# Patient Record
Sex: Female | Born: 1980 | Race: White | Hispanic: No | Smoking: Never smoker
Health system: Southern US, Community
[De-identification: ages and names within clinical notes are randomized; demographics above are authoritative.]

## PROBLEM LIST (undated history)

## (undated) DIAGNOSIS — N3010 Interstitial cystitis (chronic) without hematuria: Secondary | ICD-10-CM

## (undated) DIAGNOSIS — B009 Herpesviral infection, unspecified: Secondary | ICD-10-CM

## (undated) DIAGNOSIS — F988 Other specified behavioral and emotional disorders with onset usually occurring in childhood and adolescence: Secondary | ICD-10-CM

## (undated) HISTORY — DX: Other specified behavioral and emotional disorders with onset usually occurring in childhood and adolescence: F98.8

## (undated) HISTORY — DX: Herpesviral infection, unspecified: B00.9

---

## 2000-01-10 ENCOUNTER — Other Ambulatory Visit: Admission: RE | Admit: 2000-01-10 | Discharge: 2000-01-10 | Payer: Self-pay | Admitting: Obstetrics and Gynecology

## 2000-01-23 ENCOUNTER — Emergency Department (HOSPITAL_COMMUNITY): Admission: EM | Admit: 2000-01-23 | Discharge: 2000-01-23 | Payer: Self-pay | Admitting: *Deleted

## 2000-03-24 ENCOUNTER — Encounter: Payer: Self-pay | Admitting: *Deleted

## 2000-03-24 ENCOUNTER — Inpatient Hospital Stay (HOSPITAL_COMMUNITY): Admission: AD | Admit: 2000-03-24 | Discharge: 2000-03-24 | Payer: Self-pay | Admitting: *Deleted

## 2000-04-02 ENCOUNTER — Encounter: Admission: RE | Admit: 2000-04-02 | Discharge: 2000-04-02 | Payer: Self-pay | Admitting: Obstetrics

## 2000-04-16 ENCOUNTER — Other Ambulatory Visit: Admission: RE | Admit: 2000-04-16 | Discharge: 2000-04-16 | Payer: Self-pay | Admitting: Obstetrics and Gynecology

## 2000-04-16 ENCOUNTER — Encounter (INDEPENDENT_AMBULATORY_CARE_PROVIDER_SITE_OTHER): Payer: Self-pay

## 2000-05-11 ENCOUNTER — Emergency Department (HOSPITAL_COMMUNITY): Admission: EM | Admit: 2000-05-11 | Discharge: 2000-05-11 | Payer: Self-pay | Admitting: Emergency Medicine

## 2008-02-22 ENCOUNTER — Ambulatory Visit: Payer: Self-pay | Admitting: Occupational Medicine

## 2008-02-25 ENCOUNTER — Ambulatory Visit: Payer: Self-pay | Admitting: Family Medicine

## 2008-02-25 DIAGNOSIS — J029 Acute pharyngitis, unspecified: Secondary | ICD-10-CM

## 2008-02-25 LAB — CONVERTED CEMR LAB
Heterophile Ab Screen: NEGATIVE
Rapid Strep: NEGATIVE

## 2008-05-16 ENCOUNTER — Ambulatory Visit: Payer: Self-pay | Admitting: Occupational Medicine

## 2008-05-16 LAB — CONVERTED CEMR LAB: Beta hcg, urine, semiquantitative: NEGATIVE

## 2008-06-03 ENCOUNTER — Emergency Department (HOSPITAL_COMMUNITY): Admission: EM | Admit: 2008-06-03 | Discharge: 2008-06-03 | Payer: Self-pay | Admitting: Emergency Medicine

## 2008-10-22 ENCOUNTER — Emergency Department (HOSPITAL_COMMUNITY): Admission: EM | Admit: 2008-10-22 | Discharge: 2008-10-22 | Payer: Self-pay | Admitting: Emergency Medicine

## 2008-11-17 ENCOUNTER — Emergency Department (HOSPITAL_COMMUNITY): Admission: EM | Admit: 2008-11-17 | Discharge: 2008-11-17 | Payer: Self-pay | Admitting: Emergency Medicine

## 2008-12-07 ENCOUNTER — Emergency Department (HOSPITAL_COMMUNITY): Admission: EM | Admit: 2008-12-07 | Discharge: 2008-12-07 | Payer: Self-pay | Admitting: Emergency Medicine

## 2009-01-07 ENCOUNTER — Ambulatory Visit: Payer: Self-pay | Admitting: Family Medicine

## 2009-01-07 DIAGNOSIS — J069 Acute upper respiratory infection, unspecified: Secondary | ICD-10-CM | POA: Insufficient documentation

## 2009-01-20 ENCOUNTER — Emergency Department (HOSPITAL_COMMUNITY): Admission: EM | Admit: 2009-01-20 | Discharge: 2009-01-20 | Payer: Self-pay | Admitting: Emergency Medicine

## 2009-04-20 ENCOUNTER — Emergency Department (HOSPITAL_COMMUNITY): Admission: EM | Admit: 2009-04-20 | Discharge: 2009-04-20 | Payer: Self-pay | Admitting: Emergency Medicine

## 2009-05-11 ENCOUNTER — Emergency Department (HOSPITAL_COMMUNITY): Admission: EM | Admit: 2009-05-11 | Discharge: 2009-05-11 | Payer: Self-pay | Admitting: Emergency Medicine

## 2009-06-14 ENCOUNTER — Emergency Department (HOSPITAL_COMMUNITY): Admission: EM | Admit: 2009-06-14 | Discharge: 2009-06-14 | Payer: Self-pay | Admitting: Emergency Medicine

## 2009-09-29 ENCOUNTER — Emergency Department (HOSPITAL_COMMUNITY): Admission: EM | Admit: 2009-09-29 | Discharge: 2009-09-29 | Payer: Self-pay | Admitting: Emergency Medicine

## 2009-10-01 ENCOUNTER — Emergency Department (HOSPITAL_COMMUNITY): Admission: EM | Admit: 2009-10-01 | Discharge: 2009-10-01 | Payer: Self-pay | Admitting: Emergency Medicine

## 2009-10-02 ENCOUNTER — Emergency Department (HOSPITAL_COMMUNITY): Admission: EM | Admit: 2009-10-02 | Discharge: 2009-10-02 | Payer: Self-pay | Admitting: Emergency Medicine

## 2009-12-13 ENCOUNTER — Emergency Department (HOSPITAL_COMMUNITY): Admission: EM | Admit: 2009-12-13 | Discharge: 2009-12-13 | Payer: Self-pay | Admitting: Emergency Medicine

## 2010-01-22 ENCOUNTER — Emergency Department (HOSPITAL_COMMUNITY)
Admission: EM | Admit: 2010-01-22 | Discharge: 2010-01-22 | Payer: Self-pay | Source: Home / Self Care | Admitting: Emergency Medicine

## 2010-04-23 LAB — URINALYSIS, ROUTINE W REFLEX MICROSCOPIC
Glucose, UA: NEGATIVE mg/dL
Ketones, ur: 15 mg/dL — AB
Nitrite: NEGATIVE
Protein, ur: NEGATIVE mg/dL
Specific Gravity, Urine: 1.036 — ABNORMAL HIGH (ref 1.005–1.030)
Urobilinogen, UA: 1 mg/dL (ref 0.0–1.0)
pH: 6 (ref 5.0–8.0)

## 2010-04-23 LAB — URINE MICROSCOPIC-ADD ON

## 2010-04-25 LAB — URINALYSIS, ROUTINE W REFLEX MICROSCOPIC
Bilirubin Urine: NEGATIVE
Hgb urine dipstick: NEGATIVE
Ketones, ur: NEGATIVE mg/dL
Protein, ur: NEGATIVE mg/dL
Urobilinogen, UA: 0.2 mg/dL (ref 0.0–1.0)

## 2010-04-26 LAB — URINALYSIS, ROUTINE W REFLEX MICROSCOPIC
Bilirubin Urine: NEGATIVE
Glucose, UA: NEGATIVE mg/dL
Hgb urine dipstick: NEGATIVE
Ketones, ur: NEGATIVE mg/dL
Nitrite: NEGATIVE
Nitrite: NEGATIVE
Protein, ur: NEGATIVE mg/dL
Urobilinogen, UA: 0.2 mg/dL (ref 0.0–1.0)
pH: 6 (ref 5.0–8.0)

## 2010-04-26 LAB — POCT PREGNANCY, URINE: Preg Test, Ur: NEGATIVE

## 2010-04-30 LAB — URINALYSIS, ROUTINE W REFLEX MICROSCOPIC
Hgb urine dipstick: NEGATIVE
Nitrite: POSITIVE — AB
Specific Gravity, Urine: 1.02 (ref 1.005–1.030)
pH: 6.5 (ref 5.0–8.0)

## 2010-04-30 LAB — URINE MICROSCOPIC-ADD ON

## 2010-05-01 LAB — URINALYSIS, ROUTINE W REFLEX MICROSCOPIC
Nitrite: NEGATIVE
Specific Gravity, Urine: <1.005 — ABNORMAL LOW (ref 1.005–1.030)
Urobilinogen, UA: 0.2 mg/dL (ref 0.0–1.0)
pH: 7 (ref 5.0–8.0)

## 2010-05-01 LAB — PREGNANCY, URINE: Preg Test, Ur: NEGATIVE

## 2010-05-06 LAB — URINALYSIS, ROUTINE W REFLEX MICROSCOPIC
Bilirubin Urine: NEGATIVE
Glucose, UA: NEGATIVE mg/dL
Hgb urine dipstick: NEGATIVE
Ketones, ur: NEGATIVE mg/dL
Nitrite: NEGATIVE
Nitrite: NEGATIVE
Protein, ur: NEGATIVE mg/dL
Specific Gravity, Urine: 1.01 (ref 1.005–1.030)
Specific Gravity, Urine: 1.02 (ref 1.005–1.030)
Urobilinogen, UA: 0.2 mg/dL (ref 0.0–1.0)
pH: 7 (ref 5.0–8.0)

## 2010-05-06 LAB — URINE CULTURE

## 2010-05-16 LAB — URINALYSIS, ROUTINE W REFLEX MICROSCOPIC
Hgb urine dipstick: NEGATIVE
Specific Gravity, Urine: 1.033 — ABNORMAL HIGH (ref 1.005–1.030)
Urobilinogen, UA: 0.2 mg/dL (ref 0.0–1.0)
pH: 6.5 (ref 5.0–8.0)

## 2010-05-16 LAB — POCT PREGNANCY, URINE: Preg Test, Ur: NEGATIVE

## 2010-05-17 LAB — URINALYSIS, ROUTINE W REFLEX MICROSCOPIC
Ketones, ur: NEGATIVE mg/dL
Nitrite: NEGATIVE
Protein, ur: NEGATIVE mg/dL
pH: 6 (ref 5.0–8.0)

## 2010-05-17 LAB — POCT PREGNANCY, URINE: Preg Test, Ur: NEGATIVE
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
## 2012-03-05 ENCOUNTER — Emergency Department (HOSPITAL_BASED_OUTPATIENT_CLINIC_OR_DEPARTMENT_OTHER): Payer: Self-pay

## 2012-03-05 ENCOUNTER — Emergency Department (HOSPITAL_BASED_OUTPATIENT_CLINIC_OR_DEPARTMENT_OTHER)
Admission: EM | Admit: 2012-03-05 | Discharge: 2012-03-05 | Disposition: A | Discharge status: Home / Self Care | Payer: Self-pay | Attending: Emergency Medicine | Admitting: Emergency Medicine

## 2012-03-05 ENCOUNTER — Encounter (HOSPITAL_BASED_OUTPATIENT_CLINIC_OR_DEPARTMENT_OTHER): Payer: Self-pay | Admitting: *Deleted

## 2012-03-05 DIAGNOSIS — R109 Unspecified abdominal pain: Secondary | ICD-10-CM | POA: Insufficient documentation

## 2012-03-05 DIAGNOSIS — N10 Acute tubulo-interstitial nephritis: Secondary | ICD-10-CM | POA: Insufficient documentation

## 2012-03-05 DIAGNOSIS — F17200 Nicotine dependence, unspecified, uncomplicated: Secondary | ICD-10-CM | POA: Insufficient documentation

## 2012-03-05 DIAGNOSIS — Z87448 Personal history of other diseases of urinary system: Secondary | ICD-10-CM | POA: Insufficient documentation

## 2012-03-05 HISTORY — DX: Interstitial cystitis (chronic) without hematuria: N30.10

## 2012-03-05 LAB — URINALYSIS, ROUTINE W REFLEX MICROSCOPIC
Bilirubin Urine: NEGATIVE
Protein, ur: 100 mg/dL — AB
Urobilinogen, UA: 0.2 mg/dL (ref 0.0–1.0)

## 2012-03-05 LAB — COMPREHENSIVE METABOLIC PANEL
BUN: 24 mg/dL — ABNORMAL HIGH (ref 6–23)
Calcium: 9.6 mg/dL (ref 8.4–10.5)
Creatinine, Ser: 1 mg/dL (ref 0.50–1.10)
GFR calc Af Amer: 85 mL/min — ABNORMAL LOW (ref >90)
Glucose, Bld: 90 mg/dL (ref 70–99)
Total Protein: 7.5 g/dL (ref 6.0–8.3)

## 2012-03-05 LAB — URINE MICROSCOPIC-ADD ON

## 2012-03-05 LAB — CBC WITH DIFFERENTIAL/PLATELET
Basophils Absolute: 0 10*3/uL (ref 0.0–0.1)
Basophils Relative: 0 % (ref 0–1)
Eosinophils Absolute: 0.1 10*3/uL (ref 0.0–0.7)
MCH: 35 pg — ABNORMAL HIGH (ref 26.0–34.0)
MCHC: 33.7 g/dL (ref 30.0–36.0)
Neutrophils Relative %: 80 % — ABNORMAL HIGH (ref 43–77)
Platelets: 331 10*3/uL (ref 150–400)

## 2012-03-05 LAB — LIPASE, BLOOD: Lipase: 33 U/L (ref 11–59)

## 2012-03-05 LAB — PREGNANCY, URINE: Preg Test, Ur: NEGATIVE

## 2012-03-05 MED ORDER — HYDROCODONE-ACETAMINOPHEN 5-325 MG PO TABS: 1.0000 | ORAL_TABLET | Freq: Four times a day (QID) | ORAL | Status: DC | PRN

## 2012-03-05 MED ORDER — CIPROFLOXACIN HCL 500 MG PO TABS: 500.0000 mg | ORAL_TABLET | Freq: Two times a day (BID) | ORAL | Status: DC

## 2012-03-05 MED ORDER — ONDANSETRON 8 MG PO TBDP: 8.0000 mg | ORAL_TABLET | Freq: Three times a day (TID) | ORAL | Status: DC | PRN

## 2012-03-05 MED ORDER — HYDROMORPHONE HCL PF 1 MG/ML IJ SOLN
1.0000 mg | INTRAMUSCULAR | Status: DC | PRN
  Administered 2012-03-05 (×2): 1 mg via INTRAVENOUS
  Filled 2012-03-05 (×2): qty 1

## 2012-03-05 MED ORDER — SODIUM CHLORIDE 0.9 % IV SOLN
1000.0000 mL | Freq: Once | INTRAVENOUS | Status: AC
  Administered 2012-03-05: 1000 mL via INTRAVENOUS

## 2012-03-05 MED ORDER — ONDANSETRON HCL 4 MG/2ML IJ SOLN
4.0000 mg | Freq: Once | INTRAMUSCULAR | Status: AC
  Administered 2012-03-05: 4 mg via INTRAVENOUS
  Filled 2012-03-05: qty 2

## 2012-03-05 MED ORDER — SODIUM CHLORIDE 0.9 % IV SOLN
1000.0000 mL | INTRAVENOUS | Status: DC
  Administered 2012-03-05: 1000 mL via INTRAVENOUS

## 2012-03-05 MED ORDER — DEXTROSE 5 % IV SOLN
1.0000 g | INTRAVENOUS | Status: DC
  Administered 2012-03-05: 1 g via INTRAVENOUS
  Filled 2012-03-05: qty 10

## 2012-03-05 NOTE — ED Notes (Signed)
Pt amb to room 6 with quick steady gait in nad. Pt reports sudden onset 2 days ago of right flank pain radiating to right lq, with dysuria.

## 2012-03-05 NOTE — ED Provider Notes (Signed)
History    CSN: [id number] Arrival date & time 03/05/12  1032 First MD Initiated Contact with Patient 03/05/12 1056      Chief Complaint  Patient presents with  . Dysuria  . Flank Pain    HPI Patient presents to emergency room with complaints of right flank pain. It started 2 days ago but has progressed in intensity and severity. Now the pain is sharp and severe. It increases with deep breathing. She feels nauseous but has not had any vomiting or diarrhea. Pain does shoot towards her lower abdomen. She does have some discomfort with urination but she has a history of interstitial cystitis and does not feel that this is particularly different than usual.   She denies history of kidney stones. She denies any prior abdominal surgeries.  He is to be on medications for interstitial cystitis but stopped taking about 2 years ago. She does have an internally placed stimulator to help her with her interstitial cystitis pain. Past Medical History  Diagnosis Date  . Interstitial cystitis     History reviewed. No pertinent past surgical history.  History reviewed. No pertinent family history.  History  Substance Use Topics  . Smoking status: Current Every Day Smoker  . Smokeless tobacco: Not on file  . Alcohol Use:     OB History    Grav Para Term Preterm Abortions TAB SAB Ect Mult Living                  Review of Systems  All other systems reviewed and are negative.    Allergies  Doxycycline hyclate  Home Medications  No current outpatient prescriptions on file.  BP 149/107  Pulse 82  Temp 97.4 F (36.3 C) (Oral)  Resp 24  Ht 5\' 4"  (1.626 m)  Wt 160 lb (72.576 kg)  BMI 27.46 kg/m2  SpO2 100%  LMP 02/20/2012  Physical Exam  Nursing note and vitals reviewed. Constitutional: She appears well-developed and well-nourished. She appears distressed.  HENT:  Head: Normocephalic and atraumatic.  Right Ear: External ear normal.  Left Ear: External ear normal.  Eyes:  Conjunctivae normal are normal. Right eye exhibits no discharge. Left eye exhibits no discharge. No scleral icterus.  Neck: Neck supple. No tracheal deviation present.  Cardiovascular: Normal rate, regular rhythm and intact distal pulses.   Pulmonary/Chest: Effort normal and breath sounds normal. No stridor. No respiratory distress. She has no wheezes. She has no rales.  Abdominal: Soft. Bowel sounds are normal. She exhibits no distension. There is no tenderness. There is CVA tenderness (right-sided). There is no rebound and no guarding. No hernia.  Musculoskeletal: She exhibits no edema and no tenderness.  Neurological: She is alert. She has normal strength. No sensory deficit. Cranial nerve deficit:  no gross defecits noted. She exhibits normal muscle tone. She displays no seizure activity. Coordination normal.  Skin: Skin is warm and dry. No rash noted. She is not diaphoretic.  Psychiatric: She has a normal mood and affect.    ED Course  Procedures (including critical care time) ED Medication Orders          Start      Status  Ordering Provider     03/05/12 1230    cefTRIAXone (ROCEPHIN) 1 g in dextrose 5 % 50 mL IVPB Every 24 hours  Last MAR action: New Bag/Given  Tyreque Finken R        Route: Intravenous Ordered Dose: 1 g  03/05/12 1115    ondansetron (ZOFRAN) injection 4 mg Once  Last MAR action: Given  Alaylah Heatherington R        Route: Intravenous Ordered Dose: 4 mg                  03/05/12 1115    0.9 % sodium chloride infusion Once  Last MAR action: Stopped  Azalia Neuberger R        Route: Intravenous Ordered Dose: 1,000 mL                          03/05/12 1115    0.9 % sodium chloride infusion Continuous  Last MAR action: New Bag/Given  Jveon Pound R        Route: Intravenous Ordered Dose: 1,000 mL                          03/05/12 1103    HYDROmorphone (DILAUDID) injection 1 mg Every 30 min PRN           Route: Intravenous Ordered Dose: 1 mg        Labs Reviewed    URINALYSIS, ROUTINE W REFLEX MICROSCOPIC - Abnormal; Notable for the following:    APPearance CLOUDY (*)     Hgb urine dipstick LARGE (*)     Protein, ur 100 (*)     Nitrite POSITIVE (*)     Leukocytes, UA MODERATE (*)     All other components within normal limits  COMPREHENSIVE METABOLIC PANEL - Abnormal; Notable for the following:    CO2 18 (*)     BUN 24 (*)     AST 44 (*)     GFR calc non Af Amer 74 (*)     GFR calc Af Amer 85 (*)     All other components within normal limits  CBC WITH DIFFERENTIAL - Abnormal; Notable for the following:    WBC 12.0 (*)     RBC 3.57 (*)     MCV 103.9 (*)     MCH 35.0 (*)     Neutrophils Relative 80 (*)     Neutro Abs 9.6 (*)     All other components within normal limits  URINE MICROSCOPIC-ADD ON - Abnormal; Notable for the following:    Bacteria, UA MANY (*)     All other components within normal limits  LIPASE, BLOOD  PREGNANCY, URINE  URINE CULTURE   Ct Abdomen Pelvis Wo Contrast  03/05/2012  *RADIOLOGY REPORT*  Clinical Data: Right flank pain, dysuria  CT ABDOMEN AND PELVIS WITHOUT CONTRAST  Technique:  Multidetector CT imaging of the abdomen and pelvis was performed following the standard protocol without intravenous contrast.  Comparison: None.  Findings: Lung bases are unremarkable.  Sagittal images of the spine are unremarkable.  Unenhanced liver, spleen, pancreas and adrenals are unremarkable.  Unenhanced kidneys shows no nephrolithiasis.  No hydronephrosis or hydroureter.  No calcified ureteral calculi are noted.  There is mild stranding of the periureteral fat proximal right ureter.  Findings may be due to a recent passed right ureteral calculus or mild right urinary tract inflammation.  Mild distended gallbladder.  No calcified gallstones are noted within gallbladder.  Moderate colonic stool.  No small bowel obstruction.  No aortic aneurysm.  No pericecal inflammation.  Normal appendix is partially visualized axial image 65.  Bilateral  distal ureter is unremarkable.  IUD in place noted.  There  is a right ovarian cyst / follicle measures 2.2 cm. Trace free fluid noted adjacent to right ovary.  No inguinal adenopathy.  No calcified calculi are noted within urinary bladder.  A sacral stimulation device is noted.  IMPRESSION:  1.  No nephrolithiasis.  No hydronephrosis or hydroureter. 2.  Minimal stranding of the periureteral fat proximal right ureter. A recent passed right ureteral calculus or right urinary tract inflammation cannot be excluded. 3.  No calcified calculi are noted within urinary bladder. 4.  No pericecal inflammation.  Normal appendix. 5.  There is a cyst / follicle within right ovary measures 2.2 cm. Trace free fluid noted adjacent to right ovary. 6.  IUD in place.   Original Report Authenticated By: Natasha Mead, M.D.    Dg Abd Acute W/chest  03/05/2012  *RADIOLOGY REPORT*  Clinical Data: Low back and flank pain.  ACUTE ABDOMEN SERIES (ABDOMEN 2 VIEW & CHEST 1 VIEW)  Comparison: None.  Findings: The right chest x-ray is normal.  No pleural effusion.  Two views of the abdomen demonstrate an unremarkable bowel gas pattern.  No findings for obstruction and/or perforation.  The soft tissue shadows of the abdomen maintained.  No worrisome calcifications.  A sacral nerve root stimulator is noted along with an IUD.  IMPRESSION:  1.  No acute cardiopulmonary findings. 2.  No plain film findings for an acute abdominal process.   Original Report Authenticated By: Rudie Meyer, M.D.      1. Acute pyelonephritis       MDM  The patient's evaluation is consistent with pyelonephritis.  Her CT scan, urinalysis and clinical exam are consistent with that.  The patient has not had any vomiting here in the emergency department. She has had symptomatic relief with IV fluids and pain medications.  I did discuss with her the abnormal bicarbonate lab on her metabolic panel.  Recommended to get rechecked within 24-48 hours. The patient does not  appear toxic and I doubt sepsis. She is not tachycardic or hypotensive.. patient was instructed to return emergently rechecked if she is unable to see her primary care doctor in the next 24-48 hours.          Celene Kras, MD 03/05/12 [phone]

## 2012-03-05 NOTE — ED Notes (Signed)
Patient transported to X-ray 

## 2012-03-07 LAB — URINE CULTURE: Colony Count: >=100000

## 2012-03-08 NOTE — ED Notes (Signed)
+  Urine. Patient treated with Cipro. Sensitive to same. Per protocol MD. °

## 2015-11-08 ENCOUNTER — Encounter: Payer: Self-pay | Admitting: Obstetrics & Gynecology

## 2015-11-22 ENCOUNTER — Encounter: Payer: Self-pay | Admitting: Obstetrics & Gynecology

## 2015-11-22 ENCOUNTER — Ambulatory Visit (INDEPENDENT_AMBULATORY_CARE_PROVIDER_SITE_OTHER): Payer: Managed Care, Other (non HMO) | Admitting: Obstetrics & Gynecology

## 2015-11-22 ENCOUNTER — Encounter: Payer: Self-pay | Admitting: *Deleted

## 2015-11-22 VITALS — BP 106/63 | HR 89 | Resp 16 | Ht 64.5 in | Wt 148.0 lb

## 2015-11-22 DIAGNOSIS — Z124 Encounter for screening for malignant neoplasm of cervix: Secondary | ICD-10-CM

## 2015-11-22 DIAGNOSIS — Z01419 Encounter for gynecological examination (general) (routine) without abnormal findings: Secondary | ICD-10-CM

## 2015-11-22 DIAGNOSIS — Z1151 Encounter for screening for human papillomavirus (HPV): Secondary | ICD-10-CM

## 2015-11-22 DIAGNOSIS — Z30432 Encounter for removal of intrauterine contraceptive device: Secondary | ICD-10-CM

## 2015-11-22 NOTE — Progress Notes (Signed)
GYNECOLOGY ANNUAL PREVENTATIVE CARE ENCOUNTER NOTE  Subjective:   Kaitlyn Brewer is a 35 y.o. 432P2002 female here for a routine annual gynecologic exam.  Current complaints: desires IUD removal as she desires to conceive soon. Currently in same-sex relationship, they have decided to have a baby soon; she is here with her partner.  Denies abnormal vaginal bleeding, discharge, pelvic pain, problems with intercourse or other gynecologic concerns.    Gynecologic History Patient's last menstrual period was 11/06/2015. Contraception: IUD, wants to conceive soon. Last Pap: 2015. Results were: normal  Obstetric History OB History  Gravida Para Term Preterm AB Living  2 2 2     2   SAB TAB Ectopic Multiple Live Births               # Outcome Date GA Lbr Len/2nd Weight Sex Delivery Anes PTL Lv  2 Term      Vag-Spont     1 Term      Vag-Spont         Past Medical History:  Diagnosis Date  . Interstitial cystitis     No past surgical history on file.  No current outpatient prescriptions on file prior to visit.   No current facility-administered medications on file prior to visit.     Allergies  Allergen Reactions  . Doxycycline Hyclate     REACTION: rash and swelling    Social History   Social History  . Marital status: Single    Spouse name: N/A  . Number of children: N/A  . Years of education: N/A   Occupational History  . Nurse    Social History Main Topics  . Smoking status: Never Smoker  . Smokeless tobacco: Never Used  . Alcohol use No  . Drug use: No  . Sexual activity: Not Currently    Partners: Female    Birth control/ protection: IUD   Other Topics Concern  . Not on file   Social History Narrative  . No narrative on file    Family History  Problem Relation Age of Onset  . Hypertension Father   . Diabetes Maternal Grandmother   . Alzheimer's disease Paternal Grandfather     The following portions of the patient's history were reviewed and  updated as appropriate: allergies, current medications, past family history, past medical history, past social history, past surgical history and problem list.  Review of Systems Pertinent items noted in HPI and remainder of comprehensive ROS otherwise negative.   Objective:  BP 106/63   Pulse 89   Resp 16   Ht 5' 4.5" (1.638 m)   Wt 148 lb (67.1 kg)   LMP 11/06/2015   BMI 25.01 kg/m  CONSTITUTIONAL: Well-developed, well-nourished female in no acute distress.  HENT:  Normocephalic, atraumatic, External right and left ear normal. Oropharynx is clear and moist EYES: Conjunctivae and EOM are normal. Pupils are equal, round, and reactive to light. No scleral icterus.  NECK: Normal range of motion, supple, no masses.  Normal thyroid.  SKIN: Skin is warm and dry. No rash noted. Not diaphoretic. No erythema. No pallor. NEUROLOGIC: Alert and oriented to person, place, and time. Normal reflexes, muscle tone coordination. No cranial nerve deficit noted. PSYCHIATRIC: Normal mood and affect. Normal behavior. Normal judgment and thought content. CARDIOVASCULAR: Normal heart rate noted, regular rhythm RESPIRATORY: Clear to auscultation bilaterally. Effort and breath sounds normal, no problems with respiration noted. BREASTS: Symmetric in size. No masses, skin changes, nipple drainage, or lymphadenopathy. ABDOMEN:  Soft, normal bowel sounds, no distention noted.  No tenderness, rebound or guarding.  PELVIC: Normal appearing external genitalia; normal appearing vaginal mucosa and cervix.  No abnormal discharge noted.  Pap smear obtained.  Normal uterine size, no other palpable masses, no uterine or adnexal tenderness. MUSCULOSKELETAL: Normal range of motion. No tenderness.  No cyanosis, clubbing, or edema.  2+ distal pulses.   IUD Removal  Patient identified, informed consent performed, consent signed.  During the pelvic exam, the strings of the IUD were grasped and pulled using ring forceps. The IUD  was removed in its entirety.  Patient tolerated the procedure well.     Assessment:  Annual gynecologic examination with pap smear IUD removal due to desire for conception   Plan:  Will follow up results of pap smear and manage accordingly. Patient plans for pregnancy soon and she was told to avoid teratogens, take PNV and folic acid.  Routine preventative health maintenance measures emphasized. Please refer to After Visit Summary for other counseling recommendations.    Jaynie Collins, MD, FACOG Attending Obstetrician & Gynecologist, [facility]

## 2015-11-22 NOTE — Patient Instructions (Signed)
Preventive Care for Adults, Female A healthy lifestyle and preventive care can promote health and wellness. Preventive health guidelines for women include the following key practices.  A routine yearly physical is a good way to check with your health care provider about your health and preventive screening. It is a chance to share any concerns and updates on your health and to receive a thorough exam.  Visit your dentist for a routine exam and preventive care every 6 months. Brush your teeth twice a day and floss once a day. Good oral hygiene prevents tooth decay and gum disease.  The frequency of eye exams is based on your age, health, family medical history, use of contact lenses, and other factors. Follow your health care provider's recommendations for frequency of eye exams.  Eat a healthy diet. Foods like vegetables, fruits, whole grains, low-fat dairy products, and lean protein foods contain the nutrients you need without too many calories. Decrease your intake of foods high in solid fats, added sugars, and salt. Eat the right amount of calories for you.Get information about a proper diet from your health care provider, if necessary.  Regular physical exercise is one of the most important things you can do for your health. Most adults should get at least 150 minutes of moderate-intensity exercise (any activity that increases your heart rate and causes you to sweat) each week. In addition, most adults need muscle-strengthening exercises on 2 or more days a week.  Maintain a healthy weight. The body mass index (BMI) is a screening tool to identify possible weight problems. It provides an estimate of body fat based on height and weight. Your health care provider can find your BMI and can help you achieve or maintain a healthy weight.For adults 20 years and older:  A BMI below 18.5 is considered underweight.  A BMI of 18.5 to 24.9 is normal.  A BMI of 25 to 29.9 is considered overweight.  A  BMI of 30 and above is considered obese.  Maintain normal blood lipids and cholesterol levels by exercising and minimizing your intake of saturated fat. Eat a balanced diet with plenty of fruit and vegetables. Blood tests for lipids and cholesterol should begin at age 45 and be repeated every 5 years. If your lipid or cholesterol levels are high, you are over 50, or you are at high risk for heart disease, you may need your cholesterol levels checked more frequently.Ongoing high lipid and cholesterol levels should be treated with medicines if diet and exercise are not working.  If you smoke, find out from your health care provider how to quit. If you do not use tobacco, do not start.  Lung cancer screening is recommended for adults aged 45-80 years who are at high risk for developing lung cancer because of a history of smoking. A yearly low-dose CT scan of the lungs is recommended for people who have at least a 30-pack-year history of smoking and are a current smoker or have quit within the past 15 years. A pack year of smoking is smoking an average of 1 pack of cigarettes a day for 1 year (for example: 1 pack a day for 30 years or 2 packs a day for 15 years). Yearly screening should continue until the smoker has stopped smoking for at least 15 years. Yearly screening should be stopped for people who develop a health problem that would prevent them from having lung cancer treatment.  If you are pregnant, do not drink alcohol. If you are  breastfeeding, be very cautious about drinking alcohol. If you are not pregnant and choose to drink alcohol, do not have more than 1 drink per day. One drink is considered to be 12 ounces (355 mL) of beer, 5 ounces (148 mL) of wine, or 1.5 ounces (44 mL) of liquor.  Avoid use of street drugs. Do not share needles with anyone. Ask for help if you need support or instructions about stopping the use of drugs.  High blood pressure causes heart disease and increases the risk  of stroke. Your blood pressure should be checked at least every 1 to 2 years. Ongoing high blood pressure should be treated with medicines if weight loss and exercise do not work.  If you are 55-79 years old, ask your health care provider if you should take aspirin to prevent strokes.  Diabetes screening is done by taking a blood sample to check your blood glucose level after you have not eaten for a certain period of time (fasting). If you are not overweight and you do not have risk factors for diabetes, you should be screened once every 3 years starting at age 45. If you are overweight or obese and you are 40-70 years of age, you should be screened for diabetes every year as part of your cardiovascular risk assessment.  Breast cancer screening is essential preventive care for women. You should practice "breast self-awareness." This means understanding the normal appearance and feel of your breasts and may include breast self-examination. Any changes detected, no matter how small, should be reported to a health care provider. Women in their 20s and 30s should have a clinical breast exam (CBE) by a health care provider as part of a regular health exam every 1 to 3 years. After age 40, women should have a CBE every year. Starting at age 40, women should consider having a mammogram (breast X-ray test) every year. Women who have a family history of breast cancer should talk to their health care provider about genetic screening. Women at a high risk of breast cancer should talk to their health care providers about having an MRI and a mammogram every year.  Breast cancer gene (BRCA)-related cancer risk assessment is recommended for women who have family members with BRCA-related cancers. BRCA-related cancers include breast, ovarian, tubal, and peritoneal cancers. Having family members with these cancers may be associated with an increased risk for harmful changes (mutations) in the breast cancer genes BRCA1 and  BRCA2. Results of the assessment will determine the need for genetic counseling and BRCA1 and BRCA2 testing.  Your health care provider may recommend that you be screened regularly for cancer of the pelvic organs (ovaries, uterus, and vagina). This screening involves a pelvic examination, including checking for microscopic changes to the surface of your cervix (Pap test). You may be encouraged to have this screening done every 3 years, beginning at age 21.  For women ages 30-65, health care providers may recommend pelvic exams and Pap testing every 3 years, or they may recommend the Pap and pelvic exam, combined with testing for human papilloma virus (HPV), every 5 years. Some types of HPV increase your risk of cervical cancer. Testing for HPV may also be done on women of any age with unclear Pap test results.  Other health care providers may not recommend any screening for nonpregnant women who are considered low risk for pelvic cancer and who do not have symptoms. Ask your health care provider if a screening pelvic exam is right for   you.  If you have had past treatment for cervical cancer or a condition that could lead to cancer, you need Pap tests and screening for cancer for at least 20 years after your treatment. If Pap tests have been discontinued, your risk factors (such as having a new sexual partner) need to be reassessed to determine if screening should resume. Some women have medical problems that increase the chance of getting cervical cancer. In these cases, your health care provider may recommend more frequent screening and Pap tests.  Colorectal cancer can be detected and often prevented. Most routine colorectal cancer screening begins at the age of 50 years and continues through age 75 years. However, your health care provider may recommend screening at an earlier age if you have risk factors for colon cancer. On a yearly basis, your health care provider may provide home test kits to check  for hidden blood in the stool. Use of a small camera at the end of a tube, to directly examine the colon (sigmoidoscopy or colonoscopy), can detect the earliest forms of colorectal cancer. Talk to your health care provider about this at age 50, when routine screening begins. Direct exam of the colon should be repeated every 5-10 years through age 75 years, unless early forms of precancerous polyps or small growths are found.  People who are at an increased risk for hepatitis B should be screened for this virus. You are considered at high risk for hepatitis B if:  You were born in a country where hepatitis B occurs often. Talk with your health care provider about which countries are considered high risk.  Your parents were born in a high-risk country and you have not received a shot to protect against hepatitis B (hepatitis B vaccine).  You have HIV or AIDS.  You use needles to inject street drugs.  You live with, or have sex with, someone who has hepatitis B.  You get hemodialysis treatment.  You take certain medicines for conditions like cancer, organ transplantation, and autoimmune conditions.  Hepatitis C blood testing is recommended for all people born from 1945 through 1965 and any individual with known risks for hepatitis C.  Practice safe sex. Use condoms and avoid high-risk sexual practices to reduce the spread of sexually transmitted infections (STIs). STIs include gonorrhea, chlamydia, syphilis, trichomonas, herpes, HPV, and human immunodeficiency virus (HIV). Herpes, HIV, and HPV are viral illnesses that have no cure. They can result in disability, cancer, and death.  You should be screened for sexually transmitted illnesses (STIs) including gonorrhea and chlamydia if:  You are sexually active and are younger than 24 years.  You are older than 24 years and your health care provider tells you that you are at risk for this type of infection.  Your sexual activity has changed  since you were last screened and you are at an increased risk for chlamydia or gonorrhea. Ask your health care provider if you are at risk.  If you are at risk of being infected with HIV, it is recommended that you take a prescription medicine daily to prevent HIV infection. This is called preexposure prophylaxis (PrEP). You are considered at risk if:  You are sexually active and do not regularly use condoms or know the HIV status of your partner(s).  You take drugs by injection.  You are sexually active with a partner who has HIV.  Talk with your health care provider about whether you are at high risk of being infected with HIV. If   you choose to begin PrEP, you should first be tested for HIV. You should then be tested every 3 months for as long as you are taking PrEP.  Osteoporosis is a disease in which the bones lose minerals and strength with aging. This can result in serious bone fractures or breaks. The risk of osteoporosis can be identified using a bone density scan. Women ages 67 years and over and women at risk for fractures or osteoporosis should discuss screening with their health care providers. Ask your health care provider whether you should take a calcium supplement or vitamin D to reduce the rate of osteoporosis.  Menopause can be associated with physical symptoms and risks. Hormone replacement therapy is available to decrease symptoms and risks. You should talk to your health care provider about whether hormone replacement therapy is right for you.  Use sunscreen. Apply sunscreen liberally and repeatedly throughout the day. You should seek shade when your shadow is shorter than you. Protect yourself by wearing long sleeves, pants, a wide-brimmed hat, and sunglasses year round, whenever you are outdoors.  Once a month, do a whole body skin exam, using a mirror to look at the skin on your back. Tell your health care provider of new moles, moles that have irregular borders, moles that  are larger than a pencil eraser, or moles that have changed in shape or color.  Stay current with required vaccines (immunizations).  Influenza vaccine. All adults should be immunized every year.  Tetanus, diphtheria, and acellular pertussis (Td, Tdap) vaccine. Pregnant women should receive 1 dose of Tdap vaccine during each pregnancy. The dose should be obtained regardless of the length of time since the last dose. Immunization is preferred during the 27th-36th week of gestation. An adult who has not previously received Tdap or who does not know her vaccine status should receive 1 dose of Tdap. This initial dose should be followed by tetanus and diphtheria toxoids (Td) booster doses every 10 years. Adults with an unknown or incomplete history of completing a 3-dose immunization series with Td-containing vaccines should begin or complete a primary immunization series including a Tdap dose. Adults should receive a Td booster every 10 years.  Varicella vaccine. An adult without evidence of immunity to varicella should receive 2 doses or a second dose if she has previously received 1 dose. Pregnant females who do not have evidence of immunity should receive the first dose after pregnancy. This first dose should be obtained before leaving the health care facility. The second dose should be obtained 4-8 weeks after the first dose.  Human papillomavirus (HPV) vaccine. Females aged 13-26 years who have not received the vaccine previously should obtain the 3-dose series. The vaccine is not recommended for use in pregnant females. However, pregnancy testing is not needed before receiving a dose. If a female is found to be pregnant after receiving a dose, no treatment is needed. In that case, the remaining doses should be delayed until after the pregnancy. Immunization is recommended for any person with an immunocompromised condition through the age of 61 years if she did not get any or all doses earlier. During the  3-dose series, the second dose should be obtained 4-8 weeks after the first dose. The third dose should be obtained 24 weeks after the first dose and 16 weeks after the second dose.  Zoster vaccine. One dose is recommended for adults aged 30 years or older unless certain conditions are present.  Measles, mumps, and rubella (MMR) vaccine. Adults born  before 1957 generally are considered immune to measles and mumps. Adults born in 1957 or later should have 1 or more doses of MMR vaccine unless there is a contraindication to the vaccine or there is laboratory evidence of immunity to each of the three diseases. A routine second dose of MMR vaccine should be obtained at least 28 days after the first dose for students attending postsecondary schools, health care workers, or international travelers. People who received inactivated measles vaccine or an unknown type of measles vaccine during 1963-1967 should receive 2 doses of MMR vaccine. People who received inactivated mumps vaccine or an unknown type of mumps vaccine before 1979 and are at high risk for mumps infection should consider immunization with 2 doses of MMR vaccine. For females of childbearing age, rubella immunity should be determined. If there is no evidence of immunity, females who are not pregnant should be vaccinated. If there is no evidence of immunity, females who are pregnant should delay immunization until after pregnancy. Unvaccinated health care workers born before 1957 who lack laboratory evidence of measles, mumps, or rubella immunity or laboratory confirmation of disease should consider measles and mumps immunization with 2 doses of MMR vaccine or rubella immunization with 1 dose of MMR vaccine.  Pneumococcal 13-valent conjugate (PCV13) vaccine. When indicated, a person who is uncertain of his immunization history and has no record of immunization should receive the PCV13 vaccine. All adults 65 years of age and older should receive this  vaccine. An adult aged 19 years or older who has certain medical conditions and has not been previously immunized should receive 1 dose of PCV13 vaccine. This PCV13 should be followed with a dose of pneumococcal polysaccharide (PPSV23) vaccine. Adults who are at high risk for pneumococcal disease should obtain the PPSV23 vaccine at least 8 weeks after the dose of PCV13 vaccine. Adults older than 36 years of age who have normal immune system function should obtain the PPSV23 vaccine dose at least 1 year after the dose of PCV13 vaccine.  Pneumococcal polysaccharide (PPSV23) vaccine. When PCV13 is also indicated, PCV13 should be obtained first. All adults aged 65 years and older should be immunized. An adult younger than age 65 years who has certain medical conditions should be immunized. Any person who resides in a nursing home or long-term care facility should be immunized. An adult smoker should be immunized. People with an immunocompromised condition and certain other conditions should receive both PCV13 and PPSV23 vaccines. People with human immunodeficiency virus (HIV) infection should be immunized as soon as possible after diagnosis. Immunization during chemotherapy or radiation therapy should be avoided. Routine use of PPSV23 vaccine is not recommended for American Indians, [place] Natives, or people younger than 65 years unless there are medical conditions that require PPSV23 vaccine. When indicated, people who have unknown immunization and have no record of immunization should receive PPSV23 vaccine. One-time revaccination 5 years after the first dose of PPSV23 is recommended for people aged 19-64 years who have chronic kidney failure, nephrotic syndrome, asplenia, or immunocompromised conditions. People who received 1-2 doses of PPSV23 before age 65 years should receive another dose of PPSV23 vaccine at age 65 years or later if at least 5 years have passed since the previous dose. Doses of PPSV23 are not  needed for people immunized with PPSV23 at or after age 65 years.  Meningococcal vaccine. Adults with asplenia or persistent complement component deficiencies should receive 2 doses of quadrivalent meningococcal conjugate (MenACWY-D) vaccine. The doses should be obtained   at least 2 months apart. Microbiologists working with certain meningococcal bacteria, [place] recruits, people at risk during an outbreak, and people who travel to or live in countries with a high rate of meningitis should be immunized. A first-year college student up through age 34 years who is living in a residence hall should receive a dose if she did not receive a dose on or after her 16th birthday. Adults who have certain high-risk conditions should receive one or more doses of vaccine.  Hepatitis A vaccine. Adults who wish to be protected from this disease, have certain high-risk conditions, work with hepatitis A-infected animals, work in hepatitis A research labs, or travel to or work in countries with a high rate of hepatitis A should be immunized. Adults who were previously unvaccinated and who anticipate close contact with an international adoptee during the first 60 days after arrival in the [place] from a country with a high rate of hepatitis A should be immunized.  Hepatitis B vaccine. Adults who wish to be protected from this disease, have certain high-risk conditions, may be exposed to blood or other infectious body fluids, are household contacts or sex partners of hepatitis B positive people, are clients or workers in certain care facilities, or travel to or work in countries with a high rate of hepatitis B should be immunized.  Haemophilus influenzae type b (Hib) vaccine. A previously unvaccinated person with asplenia or sickle cell disease or having a scheduled splenectomy should receive 1 dose of Hib vaccine. Regardless of previous immunization, a recipient of a hematopoietic stem cell transplant should receive a  3-dose series 6-12 months after her successful transplant. Hib vaccine is not recommended for adults with HIV infection. Preventive Services / Frequency Ages 35 to 4 years  Blood pressure check.** / Every 3-5 years.  Lipid and cholesterol check.** / Every 5 years beginning at age 60.  Clinical breast exam.** / Every 3 years for women in their 71s and 10s.  BRCA-related cancer risk assessment.** / For women who have family members with a BRCA-related cancer (breast, ovarian, tubal, or peritoneal cancers).  Pap test.** / Every 2 years from ages 76 through 26. Every 3 years starting at age 61 through age 76 or 93 with a history of 3 consecutive normal Pap tests.  HPV screening.** / Every 3 years from ages 37 through ages 60 to 51 with a history of 3 consecutive normal Pap tests.  Hepatitis C blood test.** / For any individual with known risks for hepatitis C.  Skin self-exam. / Monthly.  Influenza vaccine. / Every year.  Tetanus, diphtheria, and acellular pertussis (Tdap, Td) vaccine.** / Consult your health care provider. Pregnant women should receive 1 dose of Tdap vaccine during each pregnancy. 1 dose of Td every 10 years.  Varicella vaccine.** / Consult your health care provider. Pregnant females who do not have evidence of immunity should receive the first dose after pregnancy.  HPV vaccine. / 3 doses over 6 months, if 93 and younger. The vaccine is not recommended for use in pregnant females. However, pregnancy testing is not needed before receiving a dose.  Measles, mumps, rubella (MMR) vaccine.** / You need at least 1 dose of MMR if you were born in 1957 or later. You may also need a 2nd dose. For females of childbearing age, rubella immunity should be determined. If there is no evidence of immunity, females who are not pregnant should be vaccinated. If there is no evidence of immunity, females who are  pregnant should delay immunization until after pregnancy.  Pneumococcal  13-valent conjugate (PCV13) vaccine.** / Consult your health care provider.  Pneumococcal polysaccharide (PPSV23) vaccine.** / 1 to 2 doses if you smoke cigarettes or if you have certain conditions.  Meningococcal vaccine.** / 1 dose if you are age 68 to 8 years and a Market researcher living in a residence hall, or have one of several medical conditions, you need to get vaccinated against meningococcal disease. You may also need additional booster doses.  Hepatitis A vaccine.** / Consult your health care provider.  Hepatitis B vaccine.** / Consult your health care provider.  Haemophilus influenzae type b (Hib) vaccine.** / Consult your health care provider. Ages 7 to 53 years  Blood pressure check.** / Every year.  Lipid and cholesterol check.** / Every 5 years beginning at age 25 years.  Lung cancer screening. / Every year if you are aged 11-80 years and have a 30-pack-year history of smoking and currently smoke or have quit within the past 15 years. Yearly screening is stopped once you have quit smoking for at least 15 years or develop a health problem that would prevent you from having lung cancer treatment.  Clinical breast exam.** / Every year after age 48 years.  BRCA-related cancer risk assessment.** / For women who have family members with a BRCA-related cancer (breast, ovarian, tubal, or peritoneal cancers).  Mammogram.** / Every year beginning at age 41 years and continuing for as long as you are in good health. Consult with your health care provider.  Pap test.** / Every 3 years starting at age 65 years through age 37 or 70 years with a history of 3 consecutive normal Pap tests.  HPV screening.** / Every 3 years from ages 72 years through ages 60 to 40 years with a history of 3 consecutive normal Pap tests.  Fecal occult blood test (FOBT) of stool. / Every year beginning at age 21 years and continuing until age 5 years. You may not need to do this test if you get  a colonoscopy every 10 years.  Flexible sigmoidoscopy or colonoscopy.** / Every 5 years for a flexible sigmoidoscopy or every 10 years for a colonoscopy beginning at age 35 years and continuing until age 48 years.  Hepatitis C blood test.** / For all people born from [birth year] through 1965 and any individual with known risks for hepatitis C.  Skin self-exam. / Monthly.  Influenza vaccine. / Every year.  Tetanus, diphtheria, and acellular pertussis (Tdap/Td) vaccine.** / Consult your health care provider. Pregnant women should receive 1 dose of Tdap vaccine during each pregnancy. 1 dose of Td every 10 years.  Varicella vaccine.** / Consult your health care provider. Pregnant females who do not have evidence of immunity should receive the first dose after pregnancy.  Zoster vaccine.** / 1 dose for adults aged 30 years or older.  Measles, mumps, rubella (MMR) vaccine.** / You need at least 1 dose of MMR if you were born in 1957 or later. You may also need a second dose. For females of childbearing age, rubella immunity should be determined. If there is no evidence of immunity, females who are not pregnant should be vaccinated. If there is no evidence of immunity, females who are pregnant should delay immunization until after pregnancy.  Pneumococcal 13-valent conjugate (PCV13) vaccine.** / Consult your health care provider.  Pneumococcal polysaccharide (PPSV23) vaccine.** / 1 to 2 doses if you smoke cigarettes or if you have certain conditions.  Meningococcal vaccine.** /  Consult your health care provider.  Hepatitis A vaccine.** / Consult your health care provider.  Hepatitis B vaccine.** / Consult your health care provider.  Haemophilus influenzae type b (Hib) vaccine.** / Consult your health care provider. Ages 64 years and over  Blood pressure check.** / Every year.  Lipid and cholesterol check.** / Every 5 years beginning at age 23 years.  Lung cancer screening. / Every year if you  are aged 16-80 years and have a 30-pack-year history of smoking and currently smoke or have quit within the past 15 years. Yearly screening is stopped once you have quit smoking for at least 15 years or develop a health problem that would prevent you from having lung cancer treatment.  Clinical breast exam.** / Every year after age 74 years.  BRCA-related cancer risk assessment.** / For women who have family members with a BRCA-related cancer (breast, ovarian, tubal, or peritoneal cancers).  Mammogram.** / Every year beginning at age 44 years and continuing for as long as you are in good health. Consult with your health care provider.  Pap test.** / Every 3 years starting at age 58 years through age 22 or 39 years with 3 consecutive normal Pap tests. Testing can be stopped between 65 and 70 years with 3 consecutive normal Pap tests and no abnormal Pap or HPV tests in the past 10 years.  HPV screening.** / Every 3 years from ages 64 years through ages 70 or 61 years with a history of 3 consecutive normal Pap tests. Testing can be stopped between 65 and 70 years with 3 consecutive normal Pap tests and no abnormal Pap or HPV tests in the past 10 years.  Fecal occult blood test (FOBT) of stool. / Every year beginning at age 40 years and continuing until age 27 years. You may not need to do this test if you get a colonoscopy every 10 years.  Flexible sigmoidoscopy or colonoscopy.** / Every 5 years for a flexible sigmoidoscopy or every 10 years for a colonoscopy beginning at age 7 years and continuing until age 32 years.  Hepatitis C blood test.** / For all people born from [birth year] through 1965 and any individual with known risks for hepatitis C.  Osteoporosis screening.** / A one-time screening for women ages 30 years and over and women at risk for fractures or osteoporosis.  Skin self-exam. / Monthly.  Influenza vaccine. / Every year.  Tetanus, diphtheria, and acellular pertussis (Tdap/Td)  vaccine.** / 1 dose of Td every 10 years.  Varicella vaccine.** / Consult your health care provider.  Zoster vaccine.** / 1 dose for adults aged 35 years or older.  Pneumococcal 13-valent conjugate (PCV13) vaccine.** / Consult your health care provider.  Pneumococcal polysaccharide (PPSV23) vaccine.** / 1 dose for all adults aged 46 years and older.  Meningococcal vaccine.** / Consult your health care provider.  Hepatitis A vaccine.** / Consult your health care provider.  Hepatitis B vaccine.** / Consult your health care provider.  Haemophilus influenzae type b (Hib) vaccine.** / Consult your health care provider. ** Family history and personal history of risk and conditions may change your health care provider's recommendations.   This information is not intended to replace advice given to you by your health care provider. Make sure you discuss any questions you have with your health care provider.   Document Released: 03/25/2001 Document Revised: 02/17/2014 Document Reviewed: 06/24/2010 Elsevier Interactive Patient Education [employer].

## 2015-11-27 LAB — CYTOLOGY - PAP
DIAGNOSIS: NEGATIVE
HPV (WINDOPATH): NOT DETECTED

## 2016-02-14 ENCOUNTER — Encounter: Payer: Self-pay | Admitting: Obstetrics & Gynecology

## 2016-02-14 ENCOUNTER — Ambulatory Visit (INDEPENDENT_AMBULATORY_CARE_PROVIDER_SITE_OTHER): Payer: Managed Care, Other (non HMO) | Admitting: Obstetrics & Gynecology

## 2016-02-14 VITALS — BP 107/63 | HR 73 | Ht 64.0 in | Wt 144.0 lb

## 2016-02-14 DIAGNOSIS — Z30430 Encounter for insertion of intrauterine contraceptive device: Secondary | ICD-10-CM

## 2016-02-14 DIAGNOSIS — Z3202 Encounter for pregnancy test, result negative: Secondary | ICD-10-CM | POA: Diagnosis not present

## 2016-02-14 LAB — POCT URINE PREGNANCY: Preg Test, Ur: NEGATIVE

## 2016-02-14 MED ORDER — PARAGARD INTRAUTERINE COPPER IU IUD
INTRAUTERINE_SYSTEM | Freq: Once | INTRAUTERINE | Status: AC
  Administered 2016-02-14: 15:00:00 via INTRAUTERINE

## 2016-02-14 NOTE — Addendum Note (Signed)
Addended by: Granville LewisLARK, Tennelle Taflinger L on: 02/14/2016 03:25 PM   Modules accepted: Orders

## 2016-02-14 NOTE — Progress Notes (Signed)
   Subjective:    Patient ID: Kaitlyn Brewer, female    DOB: 1980/07/31, 36 y.o.   MRN: [id number]  HPI 36 yo SW P2 (12 and 37 yo kids) here to discuss birth control options. She is bisexual. She has been abstinent for 2 months. She does want to protect herself from pregnancy should she become sexually active with a man. She has had a Mirena twice in the past.   Review of Systems She is a vegan and prefers contraception that doesn't involve hormones or taking medicines.  Currently her periods only last about a day or 2 and are very light.    Objective:   Physical Exam [id number] Breathing, conversing, and ambulating normally  UPT negative, consent signed, Time out procedure done. Cervix prepped with betadine and grasped with a single tooth tenaculum. Paragard was easily placed and the strings were cut to 3-4 cm. Uterus sounded to 9 cm. She tolerated the procedure well.      Assessment & Plan:  Discussed non- hormonal contraceptive options including the sponge, Paragard IUD (understands that her periods may be heavy). Paragard placed RTC 1 month for string check

## 2016-02-14 NOTE — Addendum Note (Signed)
Addended by: Kathie DikeSOLA, Abdishakur Gottschall J on: 02/14/2016 03:21 PM   Modules accepted: Orders

## 2017-12-09 ENCOUNTER — Ambulatory Visit: Payer: Managed Care, Other (non HMO) | Admitting: Family Medicine

## 2018-07-22 ENCOUNTER — Other Ambulatory Visit: Payer: Self-pay

## 2018-07-22 ENCOUNTER — Ambulatory Visit: Payer: Commercial Managed Care - PPO | Admitting: Obstetrics & Gynecology

## 2018-07-22 ENCOUNTER — Encounter: Payer: Self-pay | Admitting: Obstetrics & Gynecology

## 2018-07-22 VITALS — BP 122/68 | HR 65 | Resp 16 | Ht 64.0 in | Wt 146.0 lb

## 2018-07-22 DIAGNOSIS — N938 Other specified abnormal uterine and vaginal bleeding: Secondary | ICD-10-CM

## 2018-07-22 NOTE — Progress Notes (Signed)
   Subjective:    Patient ID: Kaitlyn Brewer, female    DOB: 1980/12/22, 38 y.o.   MRN: [id number]  HPI 38 yo married P2 (13 and 76 yo kids) here today with an irregular and long period of bleeding, basically the last 12 days. She normally has monthly periods lasting 4-5 days. She had a Paragard IUD placed 1/18. She has nausea and dizziness. Review of Systems     Objective:   Physical Exam Breathing, conversing, and ambulating normally Well nourished, well hydrated White female, no apparent distress Spec exam reveals a normal cervix and Paragard strings about 1 cm long     Assessment & Plan:  DUB- check TSH, CBC, gyn u/s Come back in 1-2 weeks for annual

## 2018-07-23 LAB — CBC
HCT: 39.3 % (ref 35.0–45.0)
Hemoglobin: 13.6 g/dL (ref 11.7–15.5)
MCH: 32.5 pg (ref 27.0–33.0)
MCHC: 34.6 g/dL (ref 32.0–36.0)
MCV: 93.8 fL (ref 80.0–100.0)
MPV: 11 fL (ref 7.5–12.5)
Platelets: 319 10*3/uL (ref 140–400)
RBC: 4.19 10*6/uL (ref 3.80–5.10)
RDW: 12.3 % (ref 11.0–15.0)
WBC: 6.5 10*3/uL (ref 3.8–10.8)

## 2018-07-23 LAB — TSH: TSH: 1.62 mIU/L

## 2018-07-28 ENCOUNTER — Other Ambulatory Visit: Payer: Commercial Managed Care - PPO

## 2018-07-28 ENCOUNTER — Other Ambulatory Visit: Payer: Self-pay

## 2018-07-28 ENCOUNTER — Ambulatory Visit (INDEPENDENT_AMBULATORY_CARE_PROVIDER_SITE_OTHER): Payer: Commercial Managed Care - PPO

## 2018-07-28 DIAGNOSIS — N938 Other specified abnormal uterine and vaginal bleeding: Secondary | ICD-10-CM | POA: Diagnosis not present

## 2018-08-17 ENCOUNTER — Telehealth: Payer: Self-pay | Admitting: *Deleted

## 2018-08-17 NOTE — Telephone Encounter (Signed)
Returned call to patient from 8:59am message. Left patient a message to call and reschedule appointment for later date.

## 2018-08-19 ENCOUNTER — Ambulatory Visit: Payer: Commercial Managed Care - PPO | Admitting: Obstetrics & Gynecology

## 2018-08-30 ENCOUNTER — Ambulatory Visit (INDEPENDENT_AMBULATORY_CARE_PROVIDER_SITE_OTHER): Payer: Commercial Managed Care - PPO | Admitting: Obstetrics & Gynecology

## 2018-08-30 ENCOUNTER — Encounter: Payer: Self-pay | Admitting: Obstetrics & Gynecology

## 2018-08-30 ENCOUNTER — Other Ambulatory Visit: Payer: Self-pay

## 2018-08-30 VITALS — BP 112/59 | HR 64 | Resp 16 | Ht 64.0 in | Wt 147.0 lb

## 2018-08-30 DIAGNOSIS — Z113 Encounter for screening for infections with a predominantly sexual mode of transmission: Secondary | ICD-10-CM

## 2018-08-30 DIAGNOSIS — Z1151 Encounter for screening for human papillomavirus (HPV): Secondary | ICD-10-CM | POA: Diagnosis not present

## 2018-08-30 DIAGNOSIS — Z124 Encounter for screening for malignant neoplasm of cervix: Secondary | ICD-10-CM

## 2018-08-30 DIAGNOSIS — Z01419 Encounter for gynecological examination (general) (routine) without abnormal findings: Secondary | ICD-10-CM

## 2018-08-30 MED ORDER — NORGESTREL-ETHINYL ESTRADIOL 0.3-30 MG-MCG PO TABS: 1.0000 | ORAL_TABLET | Freq: Every day | ORAL | 11 refills | Status: DC

## 2018-08-30 NOTE — Progress Notes (Signed)
Subjective:    Kaitlyn Brewer is a 38 y.o. married P2 (15 and 375 yo kids)  who presents for an annual exam. She has IMB, has a Paragard IUD (39 years old) . Recent TSH, u/s and CBC were normal.  The patient is sexually active. GYN screening history: last pap: was normal. The patient wears seatbelts: yes. The patient participates in regular exercise: yes. 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  Has the patient ever been transfused or tattooed?: yes. The patient reports that there is not domestic violence in her life.   Menstrual History: OB History    Gravida  2   Para  2   Term  2   Preterm      AB      Living  2     SAB      TAB      Ectopic      Multiple      Live Births              Menarche age: [age] Patient's last menstrual period was 08/26/2018.    The following portions of the patient's history were reviewed and updated as appropriate: allergies, current medications, past family history, past medical history, past social history, past surgical history and problem list.  Review of Systems Pertinent items are noted in HPI.   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  Objective:    BP (!) 112/59   Pulse 64   Resp 16   Ht 5\' 4"  (1.626 m)   Wt 147 lb (66.7 kg)   LMP 08/26/2018   BMI 25.23 kg/m   General Appearance:    Alert, cooperative, no distress, appears stated age  Head:    Normocephalic, without obvious abnormality, atraumatic  Eyes:    PERRL, conjunctiva/corneas clear, EOM's intact, fundi    benign, both eyes  Ears:    Normal TM's and external ear canals, both ears  Nose:   Nares normal, septum midline, mucosa normal, no drainage    or sinus tenderness  Throat:   Lips, mucosa, and tongue normal; teeth and gums normal  Neck:   Supple, symmetrical, trachea midline, no adenopathy;    thyroid:  no  enlargement/tenderness/nodules; no carotid   bruit or JVD  Back:     Symmetric, no curvature, ROM normal, no CVA tenderness  Lungs:     Clear to auscultation bilaterally, respirations unlabored  Chest Wall:    No tenderness or deformity   Heart:    Regular rate and rhythm, S1 and S2 normal, no murmur, rub   or gallop  Breast Exam:    No tenderness, masses, or nipple abnormality  Abdomen:     Soft, non-tender, bowel sounds active all four quadrants,    no masses, no organomegaly  Genitalia:    Normal female without lesion, discharge or tenderness, IUD strings seen (less than 1 cm long), normal size and shape, anteverted, mobile, non-tender, normal adnexal exam, some bleeding noted     Extremities:   Extremities normal, atraumatic, no cyanosis or edema  Pulses:   2+ and symmetric all extremities  Skin:   Skin color, texture, turgor normal, no rashes or lesions  Lymph nodes:   Cervical, supraclavicular, and axillary nodes normal  Neurologic:   CNII-XII intact, normal strength, sensation and reflexes    throughout  .  Assessment:    Healthy female exam.    Plan:     Thin prep Pap smear. with cotesting Lo ovral prescribed to try to fix her IMB

## 2018-09-02 LAB — CYTOLOGY - PAP
Chlamydia: NEGATIVE
Diagnosis: NEGATIVE
HPV: NOT DETECTED
Neisseria Gonorrhea: NEGATIVE

## 2018-10-14 ENCOUNTER — Encounter: Payer: Self-pay | Admitting: *Deleted

## 2018-10-14 ENCOUNTER — Telehealth: Payer: Self-pay | Admitting: *Deleted

## 2018-10-14 MED ORDER — NORETHIN ACE-ETH ESTRAD-FE 1-20 MG-MCG(24) PO TABS: 1.0000 | ORAL_TABLET | Freq: Every day | ORAL | 3 refills | Status: DC

## 2018-10-14 NOTE — Telephone Encounter (Signed)
Pt called stating that she has been on Loovral for 2 months and it makes her nauseated whether she takes it at night or morning.  Spoke with Dr Hulan Fray who recommended that we switch it to LoEstrin Fe.  This was sent to [facility] in [place].  Pt did request a 90 day supply.

## 2019-03-22 DIAGNOSIS — B009 Herpesviral infection, unspecified: Secondary | ICD-10-CM | POA: Insufficient documentation

## 2019-05-16 ENCOUNTER — Other Ambulatory Visit: Payer: Self-pay

## 2019-05-16 ENCOUNTER — Ambulatory Visit: Payer: 59 | Admitting: Certified Nurse Midwife

## 2019-05-16 ENCOUNTER — Encounter: Payer: Self-pay | Admitting: Certified Nurse Midwife

## 2019-05-16 ENCOUNTER — Other Ambulatory Visit (HOSPITAL_COMMUNITY)
Admission: RE | Admit: 2019-05-16 | Discharge: 2019-05-16 | Disposition: A | Discharge status: Home / Self Care | Payer: 59 | Source: Ambulatory Visit | Attending: Certified Nurse Midwife | Admitting: Certified Nurse Midwife

## 2019-05-16 VITALS — BP 109/67 | HR 55 | Ht 64.0 in | Wt 148.0 lb

## 2019-05-16 DIAGNOSIS — Z975 Presence of (intrauterine) contraceptive device: Secondary | ICD-10-CM

## 2019-05-16 DIAGNOSIS — N930 Postcoital and contact bleeding: Secondary | ICD-10-CM

## 2019-05-16 NOTE — Progress Notes (Signed)
   GYNECOLOGY OFFICE VISIT NOTE  History:  39 y.o. [id number] here today for post coital VB. Started 2 mos ago. New partner for last 4 mos. Denies pain with sex. States adequate lubrication. Bleeding occurs during and after IC. Has to use tampon. She denies any pelvic pain or other concerns. Her and partner had STD screen at beginning of relationship. Has copper IUD.   Past Medical History:  Diagnosis Date  . Interstitial cystitis     History reviewed. No pertinent surgical history.  The following portions of the patient's history were reviewed and updated as appropriate: allergies, current medications, past family history, past medical history, past social history, past surgical history and problem list.    Review of Systems:  Genito-Urinary ROS: positive for - post coital bleeding negative for - change in menstrual cycle, dysmenorrhea, dyspareunia, pelvic pain or vulvar/vaginal symptoms No lightheadedness or dizziness  Objective:  Physical Exam BP 109/67   Pulse (!) 55   Ht 5\' 4"  (1.626 m)   Wt 148 lb (67.1 kg)   LMP 05/02/2019   BMI 25.40 kg/m  CONSTITUTIONAL: Well-developed, well-nourished female in no acute distress.  HENT:  Normocephalic, atraumatic EYES: Conjunctivae and EOM are normal NECK: Normal range of motion SKIN: Skin is warm and dry NEUROLOGIC: Alert and oriented to person, place, and time PSYCHIATRIC: Normal mood and affect CARDIOVASCULAR: Normal heart rate noted RESPIRATORY: Effort and rate normal ABDOMEN: Soft, no distention  PELVIC: Normal appearing external genitalia; normal appearing vaginal mucosa and cervix.  No abnormal discharge noted.  Normal uterine size, no other palpable masses, no uterine or adnexal tenderness. IUD string seen. MUSCULOSKELETAL: Normal range of motion  Labs and Imaging No results found for this or any previous visit (from the past 24 hour(s)).  Assessment & Plan:  Postcoital bleeding IUD in place Aptima swab TVUS this  week Follow up as needed   Total face-to-face time with patient: 15 minutes   05/04/2019, CNM 05/16/2019 1:50 PM

## 2019-05-16 NOTE — Progress Notes (Signed)
Pt c/o bleeding during and after intercourse

## 2019-05-17 ENCOUNTER — Other Ambulatory Visit: Payer: Self-pay | Admitting: Certified Nurse Midwife

## 2019-05-17 ENCOUNTER — Ambulatory Visit (INDEPENDENT_AMBULATORY_CARE_PROVIDER_SITE_OTHER): Payer: 59

## 2019-05-17 DIAGNOSIS — B3731 Acute candidiasis of vulva and vagina: Secondary | ICD-10-CM

## 2019-05-17 DIAGNOSIS — N930 Postcoital and contact bleeding: Secondary | ICD-10-CM | POA: Diagnosis not present

## 2019-05-17 DIAGNOSIS — B9689 Other specified bacterial agents as the cause of diseases classified elsewhere: Secondary | ICD-10-CM

## 2019-05-17 DIAGNOSIS — B373 Candidiasis of vulva and vagina: Secondary | ICD-10-CM

## 2019-05-17 LAB — MOLECULAR ANCILLARY ONLY
Bacterial Vaginitis (gardnerella): POSITIVE — AB
Candida Vaginitis: POSITIVE — AB
Chlamydia: NEGATIVE
Comment: NEGATIVE
Comment: NEGATIVE
Neisseria Gonorrhea: NEGATIVE
Trichomonas: NEGATIVE

## 2019-05-17 LAB — MOLECULAR ANCILLARY ONLY???
Candida Glabrata: NEGATIVE
Comment: NEGATIVE
Comment: NEGATIVE
Comment: NEGATIVE
Comment: NORMAL

## 2019-05-17 MED ORDER — FLUCONAZOLE 150 MG PO TABS: 150.0000 mg | ORAL_TABLET | Freq: Once | ORAL | 0 refills | Status: AC

## 2019-05-17 MED ORDER — METRONIDAZOLE 500 MG PO TABS: 500.0000 mg | ORAL_TABLET | Freq: Two times a day (BID) | ORAL | 0 refills | Status: DC

## 2019-05-25 DIAGNOSIS — N939 Abnormal uterine and vaginal bleeding, unspecified: Secondary | ICD-10-CM

## 2019-05-26 MED ORDER — AZITHROMYCIN 250 MG PO TABS: ORAL_TABLET | ORAL | 0 refills | Status: DC

## 2019-06-02 ENCOUNTER — Telehealth: Payer: Self-pay | Admitting: *Deleted

## 2019-06-02 MED ORDER — FLUCONAZOLE 150 MG PO TABS: 150.0000 mg | ORAL_TABLET | Freq: Once | ORAL | 0 refills | Status: AC

## 2019-06-02 NOTE — Telephone Encounter (Signed)
Pt sent messge to see if she could have a RF on Diflucan.  She feels like it is not completely gone.  Per Fabian November, CNM she may have 2 and take 1 now and repeat in 4 days if needed.

## 2019-06-09 ENCOUNTER — Telehealth: Payer: Self-pay | Admitting: *Deleted

## 2019-06-09 MED ORDER — FLUCONAZOLE 150 MG PO TABS: 150.0000 mg | ORAL_TABLET | Freq: Once | ORAL | 1 refills | Status: AC

## 2019-06-09 NOTE — Telephone Encounter (Signed)
RX sent to [facility] in [place] for Diflucan 150 mg PO per M Bhambri,CNM

## 2019-09-14 ENCOUNTER — Telehealth: Payer: Self-pay | Admitting: *Deleted

## 2019-09-14 MED ORDER — FLUCONAZOLE 150 MG PO TABS: ORAL_TABLET | ORAL | 0 refills | Status: DC

## 2019-09-14 NOTE — Telephone Encounter (Signed)
Pt sent message saying that she has been using OTC 7 day yeast med and not helping.  She requested a RX for Diflucan be sent to her pharmacy.  Per Jesse Fall she may have 2 Diflucan and repeat in 3 days.  [facility] [address] [place]

## 2019-10-21 ENCOUNTER — Other Ambulatory Visit: Payer: Self-pay | Admitting: Certified Nurse Midwife

## 2019-10-31 ENCOUNTER — Other Ambulatory Visit: Payer: Self-pay

## 2019-10-31 ENCOUNTER — Other Ambulatory Visit (HOSPITAL_COMMUNITY)
Admission: RE | Admit: 2019-10-31 | Discharge: 2019-10-31 | Disposition: A | Discharge status: Home / Self Care | Payer: 59 | Source: Ambulatory Visit | Attending: Obstetrics & Gynecology | Admitting: Obstetrics & Gynecology

## 2019-10-31 ENCOUNTER — Ambulatory Visit (INDEPENDENT_AMBULATORY_CARE_PROVIDER_SITE_OTHER): Payer: 59 | Admitting: Obstetrics & Gynecology

## 2019-10-31 ENCOUNTER — Encounter: Payer: Self-pay | Admitting: Obstetrics & Gynecology

## 2019-10-31 VITALS — BP 125/68 | HR 71 | Resp 16 | Ht 64.0 in | Wt 151.0 lb

## 2019-10-31 DIAGNOSIS — N920 Excessive and frequent menstruation with regular cycle: Secondary | ICD-10-CM | POA: Diagnosis not present

## 2019-10-31 DIAGNOSIS — O4693 Antepartum hemorrhage, unspecified, third trimester: Secondary | ICD-10-CM | POA: Diagnosis present

## 2019-10-31 DIAGNOSIS — N939 Abnormal uterine and vaginal bleeding, unspecified: Secondary | ICD-10-CM | POA: Diagnosis not present

## 2019-11-01 LAB — RPR: RPR Ser Ql: NONREACTIVE

## 2019-11-01 LAB — CERVICOVAGINAL ANCILLARY ONLY
Bacterial Vaginitis (gardnerella): NEGATIVE
Chlamydia: NEGATIVE
Comment: NEGATIVE
Comment: NEGATIVE
Comment: NEGATIVE
Comment: NORMAL
Neisseria Gonorrhea: NEGATIVE
Trichomonas: NEGATIVE

## 2019-11-01 LAB — HEPATITIS C ANTIBODY
Hepatitis C Ab: NONREACTIVE
SIGNAL TO CUT-OFF: 0.01 (ref <1.00)

## 2019-11-01 LAB — HIV ANTIBODY (ROUTINE TESTING W REFLEX): HIV 1&2 Ab, 4th Generation: NONREACTIVE

## 2019-11-01 LAB — HEPATITIS B SURFACE ANTIGEN: Hepatitis B Surface Ag: NONREACTIVE

## 2019-11-01 NOTE — Progress Notes (Signed)
   Subjective:    Patient ID: Jorge Amparo Kovacic, adult    DOB: October 13, 1980, 39 y.o.   MRN: [id number]  HPI  39 yo female presents with vaginal discharge and spotting between periods.  Pt has copper T IUD and has had prior w.u for spotting.  Pt was separated from her husband and there was infidelity.  They are now working things out.  I suggest a full STD and vaginitis panel to work up the discharge.    COMPARISON:  07/28/2018  FINDINGS: Uterus  Measurements: 10.1 x 4.1 x 5.8 cm = volume: 126 mL. Anteverted. Normal morphology without mass. Nabothian cyst at cervix.  Endometrium  Thickness: 3 mm. IUD in expected position at the upper uterine segment endometrial canal. No endometrial fluid  Right ovary  Measurements: 2.6 x 1.4 x 2.0 cm = volume: 4 mL. Dominant follicle without mass  Left ovary  Measurements: 3.7 x 1.5 x 2.1 cm = volume: 6 mL. Dominant follicle without mass  Other findings:  No free pelvic fluid.  No adnexal masses.  IMPRESSION: Expected position of IUD in upper uterine segment endometrial canal.  Normal exam. Review of Systems  Constitutional: Negative.   HENT: Negative.   Respiratory: Negative.   Cardiovascular: Negative.   Gastrointestinal: Negative.   Genitourinary:       Spotting between menstrual periods.         Objective:   Physical Exam Vitals reviewed.  Constitutional:      General: She is not in acute distress.    Appearance: She is well-developed.  HENT:     Head: Normocephalic and atraumatic.  Eyes:     Conjunctiva/sclera: Conjunctivae normal.  Cardiovascular:     Rate and Rhythm: Normal rate.  Pulmonary:     Effort: Pulmonary effort is normal.  Abdominal:     General: Abdomen is flat.     Palpations: Abdomen is soft.  Genitourinary:    Comments: Tanner V Vulva:  No lesion Vagina:  Pink, no lesions, no discharge, no blood, IUD strings very long Cervix:  No CMT Uterus:  Non tender, mobile Right adnexa--non tender,  no mass Left adnexa--non tender, no mass  Skin:    General: Skin is warm and dry.  Neurological:     Mental Status: She is alert and oriented to person, place, and time.       Assessment & Plan:  39 yo female with vaginal d/c and spotting between cycles  1.  STD and vaginitis panel 2.  If negative, will treat empirically for chronic endometritis.  If this doesn't help spotting, will repeat US to make sure placement is good and do endometrial biopsy.    30 mins spent with patient during exam, review of records and old films, counseling and documentation.

## 2019-11-02 DIAGNOSIS — N920 Excessive and frequent menstruation with regular cycle: Secondary | ICD-10-CM | POA: Insufficient documentation

## 2019-11-02 MED ORDER — AZITHROMYCIN 250 MG PO TABS: ORAL_TABLET | ORAL | 0 refills | Status: DC

## 2019-11-07 ENCOUNTER — Other Ambulatory Visit: Payer: Self-pay | Admitting: Obstetrics & Gynecology

## 2019-11-07 MED ORDER — FLUCONAZOLE 150 MG PO TABS: 150.0000 mg | ORAL_TABLET | Freq: Once | ORAL | 1 refills | Status: AC

## 2019-11-07 NOTE — Progress Notes (Signed)
Rx diflucan sent

## 2020-02-17 ENCOUNTER — Emergency Department: Admit: 2020-02-17 | Discharge status: Absent without leave | Payer: Self-pay

## 2020-06-01 ENCOUNTER — Other Ambulatory Visit: Payer: Self-pay

## 2020-06-01 ENCOUNTER — Encounter: Payer: Self-pay | Admitting: Certified Nurse Midwife

## 2020-06-01 ENCOUNTER — Ambulatory Visit (INDEPENDENT_AMBULATORY_CARE_PROVIDER_SITE_OTHER): Payer: 59 | Admitting: Certified Nurse Midwife

## 2020-06-01 ENCOUNTER — Other Ambulatory Visit (HOSPITAL_COMMUNITY)
Admission: RE | Admit: 2020-06-01 | Discharge: 2020-06-01 | Disposition: A | Discharge status: Home / Self Care | Payer: 59 | Source: Ambulatory Visit | Attending: Certified Nurse Midwife | Admitting: Certified Nurse Midwife

## 2020-06-01 VITALS — BP 118/72 | HR 73 | Resp 16 | Ht 64.0 in | Wt 149.0 lb

## 2020-06-01 DIAGNOSIS — N898 Other specified noninflammatory disorders of vagina: Secondary | ICD-10-CM | POA: Insufficient documentation

## 2020-06-01 NOTE — Progress Notes (Signed)
   GYNECOLOGY OFFICE VISIT NOTE  History:  40 y.o. [id number] here today for possible BV. Reports having vaginal itching and took 3 days of Monistat. Now has spotting and vaginal malodor. Also reports mild LAP. This happened before and she had BV. Hx of chronic intermenstrual bleeding and endometritis. Paragard IUD in place. No new partner. Declines STD screen.   Past Medical History:  Diagnosis Date  . ADD (attention deficit disorder)   . HSV-2 infection   . Interstitial cystitis     No past surgical history on file.  The following portions of the patient's history were reviewed and updated as appropriate: allergies, current medications, past family history, past medical history, past social history, past surgical history and problem list.   Health Maintenance:  Normal pap and negative HRHPV on 2020.    Review of Systems:  Negative except noted in HPI Genito-Urinary ROS: positive for - vulvar/vaginal symptoms and spotting negative for - pelvic pain or urinary frequency/urgency   Objective:  Physical Exam BP 118/72   Pulse 73   Resp 16   Ht 5\' 4"  (1.626 m)   Wt 149 lb (67.6 kg)   LMP 05/15/2020   BMI 25.58 kg/m  CONSTITUTIONAL: Well-developed, well-nourished female in no acute distress.  HENT:  Normocephalic, atraumatic EYES: Conjunctivae and EOM are normal NECK: Normal range of motion SKIN: Skin is warm and dry NEUROLOGIC: Alert and oriented to person, place, and time PSYCHIATRIC: Normal mood and affect CARDIOVASCULAR: Normal heart rate noted RESPIRATORY: Effort and rate normal ABDOMEN: Soft, no distention  PELVIC: Normal appearing external genitalia; normal appearing vaginal mucosa and cervix. Thick pink tinged discharge.  Normal uterine size, no other palpable masses, no uterine or adnexal tenderness, no CMT. MUSCULOSKELETAL: Normal range of motion Labs and Imaging No results found for this or any previous visit (from the past 24 hour(s)).  Assessment & Plan:   1.  Vaginal discharge   -Aptima swab  Follow up prn or yearly   Total face-to-face time with patient: 10 minutes   07/15/2020, Donette Larry 06/01/2020 8:33 AM

## 2020-06-04 ENCOUNTER — Telehealth: Payer: Self-pay | Admitting: *Deleted

## 2020-06-04 LAB — CERVICOVAGINAL ANCILLARY ONLY
Bacterial Vaginitis (gardnerella): POSITIVE — AB
Candida Glabrata: NEGATIVE
Candida Vaginitis: NEGATIVE
Chlamydia: NEGATIVE
Comment: NEGATIVE
Comment: NEGATIVE
Comment: NEGATIVE
Comment: NEGATIVE
Comment: NEGATIVE
Comment: NORMAL
Neisseria Gonorrhea: NEGATIVE
Trichomonas: NEGATIVE

## 2020-06-04 MED ORDER — METRONIDAZOLE 500 MG PO TABS
500.0000 mg | ORAL_TABLET | Freq: Two times a day (BID) | ORAL | 0 refills | Status: DC
Start: 2020-06-04 — End: 2021-01-07

## 2020-06-04 NOTE — Telephone Encounter (Signed)
Pt notified of positive BV and per M Bhambri,CNM RX for Flagyl ordered and sent to [facility] in [place].

## 2020-06-07 ENCOUNTER — Other Ambulatory Visit: Payer: Self-pay

## 2020-06-07 DIAGNOSIS — N898 Other specified noninflammatory disorders of vagina: Secondary | ICD-10-CM

## 2020-06-07 MED ORDER — FLUCONAZOLE 150 MG PO TABS: 150.0000 mg | ORAL_TABLET | Freq: Once | ORAL | 0 refills | Status: AC

## 2020-06-07 NOTE — Progress Notes (Signed)
Diflucan sent per Melanie Bhambri, CNM 

## 2021-01-07 ENCOUNTER — Ambulatory Visit (INDEPENDENT_AMBULATORY_CARE_PROVIDER_SITE_OTHER): Payer: 59 | Admitting: Obstetrics & Gynecology

## 2021-01-07 ENCOUNTER — Other Ambulatory Visit: Payer: Self-pay

## 2021-01-07 ENCOUNTER — Other Ambulatory Visit (HOSPITAL_COMMUNITY)
Admission: RE | Admit: 2021-01-07 | Discharge: 2021-01-07 | Disposition: A | Discharge status: Home / Self Care | Payer: 59 | Source: Ambulatory Visit | Attending: Obstetrics & Gynecology | Admitting: Obstetrics & Gynecology

## 2021-01-07 ENCOUNTER — Encounter: Payer: Self-pay | Admitting: Obstetrics & Gynecology

## 2021-01-07 VITALS — BP 129/80 | HR 79 | Resp 16 | Ht 64.0 in | Wt 156.0 lb

## 2021-01-07 DIAGNOSIS — Z800 Family history of malignant neoplasm of digestive organs: Secondary | ICD-10-CM

## 2021-01-07 DIAGNOSIS — F988 Other specified behavioral and emotional disorders with onset usually occurring in childhood and adolescence: Secondary | ICD-10-CM

## 2021-01-07 DIAGNOSIS — Z30432 Encounter for removal of intrauterine contraceptive device: Secondary | ICD-10-CM | POA: Diagnosis not present

## 2021-01-07 DIAGNOSIS — N911 Secondary amenorrhea: Secondary | ICD-10-CM | POA: Diagnosis not present

## 2021-01-07 DIAGNOSIS — N3010 Interstitial cystitis (chronic) without hematuria: Secondary | ICD-10-CM | POA: Diagnosis not present

## 2021-01-07 DIAGNOSIS — Z113 Encounter for screening for infections with a predominantly sexual mode of transmission: Secondary | ICD-10-CM | POA: Insufficient documentation

## 2021-01-07 DIAGNOSIS — Z01411 Encounter for gynecological examination (general) (routine) with abnormal findings: Secondary | ICD-10-CM | POA: Diagnosis not present

## 2021-01-07 LAB — PROLACTIN: Prolactin: 7.1 ng/mL

## 2021-01-07 LAB — FOLLICLE STIMULATING HORMONE: FSH: 27.3 m[IU]/mL

## 2021-01-07 LAB — TSH: TSH: 1.76 mIU/L

## 2021-01-07 NOTE — Progress Notes (Signed)
LMP 9/22

## 2021-01-07 NOTE — Progress Notes (Signed)
Subjective:     Kaitlyn Brewer is a 40 y.o. adult here for a routine exam.  Current complaints: new same sex relationship--no menses and no intercourse with males.  +hot flashes, night sweats, decreased sleep,    Gynecologic History No LMP recorded. Contraception: IUD Last Pap: 2020. Results were: normal Last mammogram: 04/2020. Results were: normal  Obstetric History OB History  Gravida Para Term Preterm AB Living  2 2 2     2   SAB IAB Ectopic Multiple Live Births               # Outcome Date GA Lbr Len/2nd Weight Sex Delivery Anes PTL Lv  2 Term      Vag-Spont     1 Term      Vag-Spont        The following portions of the patient's history were reviewed and updated as appropriate: allergies, current medications, past family history, past medical history, past social history, past surgical history, and problem list.  Review of Systems Pertinent items noted in HPI and remainder of comprehensive ROS otherwise negative.    Objective:     Vitals:   01/07/21 0821  BP: 129/80  Pulse: 79  Resp: 16  Weight: 156 lb (70.8 kg)  Height: 5\' 4"  (1.626 m)   Vitals:  WNL General appearance: alert, cooperative and no distress  HEENT: Normocephalic, without obvious abnormality, atraumatic Eyes: negative Throat: lips, mucosa, and tongue normal; teeth and gums normal  Respiratory: Clear to auscultation bilaterally  CV: Regular rate and rhythm  Breasts:  Normal appearance, no masses or tenderness, no nipple retraction or dimpling  GI: Soft, non-tender; bowel sounds normal; no masses,  no organomegaly  GU: External Genitalia:  Tanner V, no lesion Urethra:  No prolapse   Vagina: Pink, normal rugae, no blood or discharge  Cervix: No CMT, no lesion, IUD strings seen  Uterus:  Normal size and contour, non tender  Adnexa: Normal, no masses, non tender  Musculoskeletal: No edema, redness or tenderness in the calves or thighs  Skin: No lesions or rash  Lymphatic: Axillary adenopathy:  none     Psychiatric: Normal mood and behavior   Vitals:   01/07/21 0821  BP: 129/80  Pulse: 79  Resp: 16  Weight: 156 lb (70.8 kg)  Height: 5\' 4"  (1.626 m)   Assessment:    Healthy female exam.  Secondary amenorrhea possible early menopause   Plan:    Pap with cotesting Tsh, Fsh, prolactin STD screening (new partner) IUD removal Pt to find out from Aunt about genetic testing for colon cancer (Maternal aunt and Grandmother have colon cancer)  IUD Removal Patient identified, informed consent performed. Discussed risks of irregular bleeding, cramping, infection, malpositioning or misplacement of the IUD outside the uterus which may require further procedures. Time out was performed. Speculum placed in the vagina. Cervix visualized. The strings of the IUD were grasped and pulled using ring forceps. The IUD was successfully removed in its entirety.

## 2021-01-08 LAB — CERVICOVAGINAL ANCILLARY ONLY
Chlamydia: NEGATIVE
Comment: NEGATIVE
Comment: NEGATIVE
Comment: NORMAL
Neisseria Gonorrhea: NEGATIVE
Trichomonas: NEGATIVE

## 2021-01-08 LAB — HEPATITIS C ANTIBODY
Hepatitis C Ab: NONREACTIVE
SIGNAL TO CUT-OFF: 0.03 (ref <1.00)

## 2021-01-08 LAB — HEPATITIS B SURFACE ANTIGEN: Hepatitis B Surface Ag: NONREACTIVE

## 2021-01-08 LAB — RPR: RPR Ser Ql: NONREACTIVE

## 2021-01-08 LAB — HIV ANTIBODY (ROUTINE TESTING W REFLEX): HIV 1&2 Ab, 4th Generation: NONREACTIVE

## 2021-01-09 ENCOUNTER — Encounter: Payer: Self-pay | Admitting: Obstetrics & Gynecology

## 2021-01-13 ENCOUNTER — Other Ambulatory Visit: Payer: Self-pay | Admitting: Obstetrics & Gynecology

## 2021-01-13 MED ORDER — MEDROXYPROGESTERONE ACETATE 10 MG PO TABS: 10.0000 mg | ORAL_TABLET | Freq: Every day | ORAL | 2 refills | Status: DC

## 2021-01-15 ENCOUNTER — Telehealth: Payer: Self-pay

## 2021-01-15 DIAGNOSIS — N911 Secondary amenorrhea: Secondary | ICD-10-CM

## 2021-01-15 MED ORDER — MEDROXYPROGESTERONE ACETATE 10 MG PO TABS: 10.0000 mg | ORAL_TABLET | Freq: Every day | ORAL | 2 refills | Status: DC

## 2021-01-15 NOTE — Telephone Encounter (Signed)
Pt called stating Rx for Provera went to wrong pharmacy. Old Rx canceled and Rx sent to correct pharmacy.

## 2021-01-21 ENCOUNTER — Encounter: Payer: Self-pay | Admitting: Obstetrics & Gynecology

## 2021-02-14 ENCOUNTER — Encounter: Payer: Self-pay | Admitting: Obstetrics & Gynecology

## 2021-02-14 DIAGNOSIS — E28319 Asymptomatic premature menopause: Secondary | ICD-10-CM | POA: Insufficient documentation

## 2021-05-06 ENCOUNTER — Telehealth: Payer: Self-pay | Admitting: *Deleted

## 2021-05-06 NOTE — Telephone Encounter (Signed)
Returned call from 12:32 PM. Left patient a message to call and schedule appointment. ?

## 2021-12-27 ENCOUNTER — Ambulatory Visit: Payer: 59 | Admitting: Obstetrics and Gynecology

## 2021-12-27 NOTE — Progress Notes (Deleted)
    GYNECOLOGY VISIT  Patient name: OSIRIS CHARLES MRN [id number]  Date of birth: 19-Mar-1980 Chief Complaint:   No chief complaint on file.   History:  SHELLA LAHMAN is a 41 y.o. G2P2002 being seen today for ***.    Past Medical History:  Diagnosis Date   ADD (attention deficit disorder)    HSV-2 infection    Interstitial cystitis     No past surgical history on file.  The following portions of the patient's history were reviewed and updated as appropriate: allergies, current medications, past family history, past medical history, past social history, past surgical history and problem list.   Health Maintenance:   Last pap 77/2020. Results were: NILM w/ HRHPV negative. H/O abnormal pap: {yes/yes***/no:23866} Last mammogram: none.    Review of Systems:  {Ros - complete:30496} Comprehensive review of systems was otherwise negative.   Objective:  Physical Exam There were no vitals taken for this visit.   Physical Exam   Labs and Imaging No results found.     Assessment & Plan:   There are no diagnoses linked to this encounter.   *** Routine preventative health maintenance measures emphasized.  Lorriane Shire, MD [facility], [facility]

## 2022-03-17 ENCOUNTER — Ambulatory Visit: Payer: Medicaid Other | Admitting: Obstetrics and Gynecology

## 2022-03-17 ENCOUNTER — Other Ambulatory Visit (HOSPITAL_COMMUNITY)
Admission: RE | Admit: 2022-03-17 | Discharge: 2022-03-17 | Disposition: A | Discharge status: Home / Self Care | Payer: Medicaid Other | Source: Ambulatory Visit | Attending: Obstetrics and Gynecology | Admitting: Obstetrics and Gynecology

## 2022-03-17 ENCOUNTER — Encounter: Payer: Self-pay | Admitting: Obstetrics and Gynecology

## 2022-03-17 VITALS — BP 106/66 | HR 72 | Ht 64.0 in | Wt 179.0 lb

## 2022-03-17 DIAGNOSIS — B3731 Acute candidiasis of vulva and vagina: Secondary | ICD-10-CM

## 2022-03-17 DIAGNOSIS — A599 Trichomoniasis, unspecified: Secondary | ICD-10-CM

## 2022-03-17 DIAGNOSIS — Z01419 Encounter for gynecological examination (general) (routine) without abnormal findings: Secondary | ICD-10-CM | POA: Insufficient documentation

## 2022-03-17 DIAGNOSIS — Z0000 Encounter for general adult medical examination without abnormal findings: Secondary | ICD-10-CM | POA: Diagnosis not present

## 2022-03-17 DIAGNOSIS — Z113 Encounter for screening for infections with a predominantly sexual mode of transmission: Secondary | ICD-10-CM | POA: Diagnosis present

## 2022-03-17 DIAGNOSIS — B9689 Other specified bacterial agents as the cause of diseases classified elsewhere: Secondary | ICD-10-CM

## 2022-03-17 DIAGNOSIS — Z1231 Encounter for screening mammogram for malignant neoplasm of breast: Secondary | ICD-10-CM

## 2022-03-17 MED ORDER — FLUCONAZOLE 150 MG PO TABS: 150.0000 mg | ORAL_TABLET | ORAL | 3 refills | Status: DC

## 2022-03-17 NOTE — Progress Notes (Signed)
ANNUAL EXAM Patient name: Kaitlyn Brewer MRN [id number]  Date of birth: [birth year] Chief Complaint:   Gynecologic Exam  History of Present Illness:   Kaitlyn Brewer is a 42 y.o. G17P2002 female being seen today for a routine annual exam.   Current complaints: Some vaginal discharge but thinks she gets recurrent yeast infections. Does 7 day monistat because she has nocturia and the medicine falls out so the shorter courses don't work. Gets yeast infections about Q3 months.   She notes she is perimenopausal. Has not gone one year but has gone 6 months without a period. She gets hot flashes and mom went through early menopause.   She is working out and eating well - recent changes for her to try to lose weight.   She notes some SUI but small amounts. Wears organic pantiliner for this daily.   She would like to be checked for STI.   Patient's last menstrual period was 01/24/2022 (approximate).  Last pap: 08/2018. Results were: NILM w/ HRHPV negative. H/O abnormal pap: no Last MXR: No recent on file  Health Maintenance Due  Topic Date Due   DTaP/Tdap/Td (1 - Tdap) Never done   PAP SMEAR-Modifier  08/29/2021   INFLUENZA VACCINE  09/10/2021   COVID-19 Vaccine (7 - 2023-24 season) 10/11/2021    Review of Systems:   Pertinent items are noted in HPI Denies any headaches, blurred vision, fatigue, shortness of breath, chest pain, abdominal pain, abnormal vaginal discharge/itching/odor/irritation, problems with periods, bowel movements, urination, or intercourse unless otherwise stated above.  Pertinent History Reviewed:  Reviewed past medical,surgical, social and family history.  Reviewed problem list, medications and allergies. Physical Assessment:   Vitals:   03/17/22 1435 03/17/22 1444  BP: (!) 100/48 106/66  Pulse: 70 72  Weight: 179 lb (81.2 kg)   Height: 5\' 4"  (1.626 m)   Body mass index is 30.73 kg/m.   Physical Examination:  General appearance - well appearing, and in  no distress Mental status - alert, oriented to person, place, and time Psych:  She has a normal mood and affect Skin - warm and dry, normal color, no suspicious lesions noted Chest - effort normal Heart - normal rate  Breasts - breasts appear normal, no suspicious masses, no skin or nipple changes or axillary nodes Abdomen - soft, nontender, nondistended, no masses or organomegaly Pelvic -  VULVA: normal appearing vulva with no masses, tenderness or lesions  VAGINA: normal appearing vagina with normal color and discharge, no lesions  CERVIX: normal appearing cervix without discharge or lesions, no CMT UTERUS: uterus is felt to be normal size, shape, consistency and nontender  ADNEXA: No adnexal masses or tenderness noted. Extremities:  No swelling or varicosities noted  Chaperone present for exam  No results found for this or any previous visit (from the past 24 hour(s)).  Assessment & Plan:  Adalaide was seen today for gynecologic exam.  Diagnoses and all orders for this visit:  Encounter for annual routine gynecological examination - Cervical cancer screening: Discussed guidelines. Pap with HPV done - STD Testing: accepts - Breast Health: Encouraged self breast awareness/SBE. Teaching provided. Discussed limits of clinical breast exam for detecting breast cancer. Rx given for MXR - F/U 12 months and prn -     Cytology - PAP  Encounter for screening mammogram for malignant neoplasm of breast -     MM 3D SCREEN BREAST BILATERAL; Future  Routine screening for STI (sexually transmitted infection) -  Cervicovaginal ancillary only( [facility]) -     HIV antibody (with reflex) -     RPR -     Hepatitis C Antibody -     Hepatitis B Surface AntiGEN  Yeast infection of the vagina Can do Diflucan with refills. Discussed impact of pantiliner on external skin. She would like to do weight loss first to see if this improves her SUI which I think is very reasonable and appropriate as  well. Discussed if diflucan doesn't help itch/discharge then evaluation would be warranted.  -     fluconazole (DIFLUCAN) 150 MG tablet; Take 1 tablet (150 mg total) by mouth every 3 (three) days. For three doses      Orders Placed This Encounter  Procedures   MM 3D SCREEN BREAST BILATERAL   HIV antibody (with reflex)   RPR   Hepatitis C Antibody   Hepatitis B Surface AntiGEN    Meds:  Meds ordered this encounter  Medications   fluconazole (DIFLUCAN) 150 MG tablet    Sig: Take 1 tablet (150 mg total) by mouth every 3 (three) days. For three doses    Dispense:  3 tablet    Refill:  3    Follow-up: No follow-ups on file.  Radene Gunning, MD 03/17/2022 3:20 PM

## 2022-03-18 LAB — HIV ANTIBODY (ROUTINE TESTING W REFLEX): HIV Screen 4th Generation wRfx: NONREACTIVE

## 2022-03-18 LAB — HEPATITIS C ANTIBODY: Hep C Virus Ab: NONREACTIVE

## 2022-03-18 LAB — RPR: RPR Ser Ql: NONREACTIVE

## 2022-03-18 LAB — HEPATITIS B SURFACE ANTIGEN: Hepatitis B Surface Ag: NEGATIVE

## 2022-03-19 LAB — CERVICOVAGINAL ANCILLARY ONLY
Bacterial Vaginitis (gardnerella): POSITIVE — AB
Candida Glabrata: NEGATIVE
Candida Vaginitis: NEGATIVE
Chlamydia: NEGATIVE
Comment: NEGATIVE
Comment: NEGATIVE
Comment: NEGATIVE
Comment: NEGATIVE
Comment: NEGATIVE
Comment: NORMAL
Neisseria Gonorrhea: NEGATIVE
Trichomonas: POSITIVE — AB

## 2022-03-19 MED ORDER — METRONIDAZOLE 500 MG PO TABS: ORAL_TABLET | ORAL | 0 refills | Status: AC

## 2022-03-19 NOTE — Addendum Note (Signed)
Addended by: Radene Gunning A on: 03/19/2022 12:07 PM   Modules accepted: Orders

## 2022-03-20 LAB — CYTOLOGY - PAP
Comment: NEGATIVE
Diagnosis: NEGATIVE
High risk HPV: NEGATIVE

## 2023-04-09 ENCOUNTER — Telehealth: Payer: Self-pay | Admitting: *Deleted

## 2023-04-09 NOTE — Telephone Encounter (Signed)
 Returned call from 8:39 AM. Left patient a message to call and schedule annual, due after 03/18/2023.

## 2023-05-01 NOTE — Progress Notes (Unsigned)
  Subjective:     Kaitlyn Brewer is a 43 y.o. adult here for a routine exam.  Current complaints: amenorrhea since Sept.     Gynecologic History Patient's last menstrual period was 10/13/2022. Contraception: abstinence Last Mammogram: feb 2022 nml Last Pap Smear:  03/17/22-neg Last Colon Screening;  n/a Seat Belts:   yes Sun Screen:   yes Dental Check Up:  yes Brush & Floss:  yes--floss twice a day   Obstetric History OB History  Gravida Para Term Preterm AB Living  2 2 2   2   SAB IAB Ectopic Multiple Live Births          # Outcome Date GA Lbr Len/2nd Weight Sex Type Anes PTL Lv  2 Term      Vag-Spont     1 Term      Vag-Spont        The following portions of the patient's history were reviewed and updated as appropriate: allergies, current medications, past family history, past medical history, past social history, past surgical history, and problem list.  Review of Systems Pertinent items noted in HPI and remainder of comprehensive ROS otherwise negative.    Objective:     Vitals:   05/11/23 1511  BP: 122/83  Pulse: 72  SpO2: 97%  Weight: 185 lb (83.9 kg)  Height: 5\' 4"  (1.626 m)   Vitals:  WNL General appearance: alert, cooperative and no distress  HEENT: Normocephalic, without obvious abnormality, atraumatic Eyes: negative Throat: lips, mucosa, and tongue normal; teeth and gums normal  Respiratory: Clear to auscultation bilaterally  CV: Regular rate and rhythm  Breasts:  Normal appearance, no masses or tenderness, no nipple retraction or dimpling  GI: Soft, non-tender; bowel sounds normal; no masses,  no organomegaly  GU: External Genitalia:  Tanner V, no lesion Urethra:  No prolapse   Vagina: Pink, normal rugae, no blood or discharge  Cervix: No CMT, no lesion  Uterus:  Normal size and contour, non tender  Adnexa: Normal, no masses, non tender  Musculoskeletal: No edema, redness or tenderness in the calves or thighs  Skin: No lesions or rash   Lymphatic: Axillary adenopathy: none     Psychiatric: Normal mood and behavior      Assessment:    Healthy female exam.    Plan:    Pap up to date. Mammogram at [facility] per pt preference Amenorrhea--Fsh and estradiol

## 2023-05-11 ENCOUNTER — Ambulatory Visit: Payer: Medicaid Other | Admitting: Obstetrics & Gynecology

## 2023-05-11 VITALS — BP 122/83 | HR 72 | Ht 64.0 in | Wt 185.0 lb

## 2023-05-11 DIAGNOSIS — Z01419 Encounter for gynecological examination (general) (routine) without abnormal findings: Secondary | ICD-10-CM

## 2023-05-11 DIAGNOSIS — N912 Amenorrhea, unspecified: Secondary | ICD-10-CM | POA: Diagnosis not present

## 2023-05-12 LAB — ESTRADIOL: Estradiol: <5 pg/mL

## 2023-05-12 LAB — FOLLICLE STIMULATING HORMONE: FSH: 89.1 m[IU]/mL

## 2023-05-13 ENCOUNTER — Encounter: Payer: Self-pay | Admitting: Obstetrics & Gynecology

## 2023-06-01 ENCOUNTER — Encounter: Payer: Self-pay | Admitting: Obstetrics & Gynecology

## 2023-06-01 ENCOUNTER — Telehealth: Admitting: Obstetrics & Gynecology

## 2023-06-01 ENCOUNTER — Other Ambulatory Visit: Payer: Self-pay | Admitting: *Deleted

## 2023-06-01 DIAGNOSIS — E28319 Asymptomatic premature menopause: Secondary | ICD-10-CM | POA: Diagnosis not present

## 2023-06-01 DIAGNOSIS — M545 Low back pain, unspecified: Secondary | ICD-10-CM | POA: Diagnosis not present

## 2023-06-01 DIAGNOSIS — N951 Menopausal and female climacteric states: Secondary | ICD-10-CM

## 2023-06-01 MED ORDER — PREDNISONE 10 MG (21) PO TBPK: ORAL_TABLET | ORAL | 0 refills | Status: DC

## 2023-06-01 MED ORDER — PROGESTERONE MICRONIZED 100 MG PO CAPS: 100.0000 mg | ORAL_CAPSULE | Freq: Every day | ORAL | 6 refills | Status: AC

## 2023-06-01 MED ORDER — ESTRADIOL 0.05 MG/24HR TD PTTW: 1.0000 | MEDICATED_PATCH | TRANSDERMAL | 6 refills | Status: DC

## 2023-06-01 MED ORDER — PROGESTERONE MICRONIZED 100 MG PO CAPS: 100.0000 mg | ORAL_CAPSULE | Freq: Every day | ORAL | 6 refills | Status: DC

## 2023-06-01 NOTE — Progress Notes (Signed)
 GYNECOLOGY VIRTUAL VISIT ENCOUNTER NOTE  Provider location: [facility] [place]   Patient location: Home  I connected with Kaitlyn Brewer on 06/01/23 at  8:10 AM EDT by MyChart Video Encounter and verified that I am speaking with the correct person using two identifiers.   I discussed the limitations, risks, security and privacy concerns of performing an evaluation and management service virtually and the availability of in person appointments. I also discussed with the patient that there may be a patient responsible charge related to this service. The patient expressed understanding and agreed to proceed.   History:  Kaitlyn Brewer is a 43 y.o. G61P2002 female being evaluated today for start of HRT and new complaint of re injuring an old back injury.    HRT--Reviewed indications for HRT, risk and benefits.  She is not sleeping and HRT will help with night sweats which can help her sleep better.  I reviewed the main purpose of hormone replacement therapy is to relieve menopausal symptoms including hot flashes and night sweats and vaginal dryness.  It can improve bone health although not the indication for taking hormone replacement therapy.  It may improve heart health but this is not universally agreed-upon.  It may improve mood by alleviating symptoms of depressions and mood swings associated with menopause.  Risk of HRT include risk of blood clots such as deep vein thrombosis and pulmonary embolism.  There is an can be an increased risk of stroke especially in the first few years of use.  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  Of the past decade she has reinjured it a couple times and requires a prednisone  taper.  She has been resting her back since this most recent injury.  She got the injury while cleaning her car.  She denies any numbness tingling or loss of strength.  She is requesting a prednisone   taper to help with her back.  She will consider returning to her orthopedist for physical therapy evaluation to see if she can strengthen her back muscles to prevent reinjury.  If she worsens she understands she must go to her primary care orthopedist.   Past Medical History:  Diagnosis Date   ADD (attention deficit disorder)    HSV-2 infection    Interstitial cystitis    No past surgical history on file. The following portions of the patient's history were reviewed and updated as appropriate: allergies, current medications, past family history, past medical history, past social history, past surgical history and problem list.    Review of Systems:  Pertinent items noted in HPI and remainder of comprehensive ROS otherwise negative.  Physical Exam:   General:  Alert, oriented and cooperative. Patient appears to be in no acute distress.  Mental Status: Normal mood and affect. Normal behavior. Normal judgment and thought content.   Respiratory: Normal respiratory effort, no problems with respiration noted  Rest of physical exam deferred due to type of encounter  Labs and Imaging No results found for this or any previous visit (from the past 2 weeks). No results found.     Assessment and Plan:      Early Menopausal and symptoms (<age 46)--see HPI above for counseling.  Vivelle -Dot 50 mcg twice a week patch prescribed.  100 mg of Prometrium  at night which can also help with sleep. Low back pain--see HPI for further details.  Prednisone  Dosepak given to patient.  If she is not feeling better she will go visit her primary care doctor or orthopedist.      I discussed the assessment and treatment plan with the patient. The patient was provided an opportunity to ask questions and all were answered. The patient agreed with the plan and demonstrated an understanding of the instructions.   The patient was advised to call back or seek an in-person evaluation/go to the ED if the symptoms worsen or  if the condition fails to improve as anticipated.  I provided 15 minutes of face-to-face time during this encounter. I also spent 15 minutes dedicated to the care of this patient including pre-visit review of records, post visit ordering of medications and appropriate tests or procedures, coordinating care and documenting this visit encounter.    Kaitlyn Lark, MD [facility] [employer] [employer], [facility]

## 2023-06-01 NOTE — Progress Notes (Signed)
 Pt requested meds to be sent to new pharmacy, see mychart message

## 2023-06-11 ENCOUNTER — Ambulatory Visit

## 2023-06-11 DIAGNOSIS — Z01419 Encounter for gynecological examination (general) (routine) without abnormal findings: Secondary | ICD-10-CM

## 2023-06-18 ENCOUNTER — Encounter: Payer: Self-pay | Admitting: Obstetrics & Gynecology

## 2023-08-21 ENCOUNTER — Encounter: Payer: Self-pay | Admitting: Obstetrics & Gynecology

## 2023-11-02 ENCOUNTER — Encounter: Payer: Self-pay | Admitting: Obstetrics & Gynecology

## 2023-11-02 DIAGNOSIS — N951 Menopausal and female climacteric states: Secondary | ICD-10-CM

## 2023-11-02 MED ORDER — ESTRADIOL 0.075 MG/24HR TD PTTW: 1.0000 | MEDICATED_PATCH | TRANSDERMAL | 12 refills | Status: AC

## 2024-01-05 ENCOUNTER — Other Ambulatory Visit: Payer: Self-pay | Admitting: Medical Genetics

## 2024-02-22 ENCOUNTER — Other Ambulatory Visit (HOSPITAL_COMMUNITY)
Admission: RE | Admit: 2024-02-22 | Discharge: 2024-02-22 | Disposition: A | Source: Ambulatory Visit | Attending: Obstetrics & Gynecology | Admitting: Obstetrics & Gynecology

## 2024-02-22 ENCOUNTER — Ambulatory Visit (INDEPENDENT_AMBULATORY_CARE_PROVIDER_SITE_OTHER): Admitting: Obstetrics & Gynecology

## 2024-02-22 ENCOUNTER — Encounter: Payer: Self-pay | Admitting: Obstetrics & Gynecology

## 2024-02-22 ENCOUNTER — Telehealth: Payer: Self-pay

## 2024-02-22 VITALS — BP 127/78 | HR 64 | Ht 64.0 in | Wt 175.0 lb

## 2024-02-22 DIAGNOSIS — Z113 Encounter for screening for infections with a predominantly sexual mode of transmission: Secondary | ICD-10-CM | POA: Insufficient documentation

## 2024-02-22 DIAGNOSIS — N930 Postcoital and contact bleeding: Secondary | ICD-10-CM | POA: Diagnosis not present

## 2024-02-22 DIAGNOSIS — N941 Unspecified dyspareunia: Secondary | ICD-10-CM | POA: Diagnosis not present

## 2024-02-22 DIAGNOSIS — N924 Excessive bleeding in the premenopausal period: Secondary | ICD-10-CM | POA: Diagnosis not present

## 2024-02-22 NOTE — Progress Notes (Signed)
" ° °  Subjective:    Patient ID: Kaitlyn Brewer, adult    DOB: 08/04/1980, 44 y.o.   MRN: [id number]  HPI  44 yo female presents for bleeding during intercourse.  Pt has new partner.  They began having intercourse in August.  They are planning on getting married.  Since beginning intercourse she has bleeding during intercourse.  There is pain with deep penetration.  He is noted to be markedly larger than prior partner.  Patient has also had a change in her menstrual cycles.  She was noted to be in perimenopause in 2025.  Her last menstrual cycle was August/September 2024.  I saw her in March 2025 with menopausal symptoms.  She began estradiol  and Prometrium .  She then had menstrual cycles in June, July, August 2025.  Fedora noted that the Prometrium  made her feel very sluggish in the morning.  She tried taking earlier in the evening based on the patient message August 21, 2023.  This did help for a while.  When the daytime drowsiness worsened she began taking the Prometrium  4x a week in approximately August.  This also about the same time frame where she began having intercourse and bleeding with intercourse.  Most recently started her menses January 9th and is currently having a light flow  Review of Systems  Constitutional: Negative.        No night sweats  Respiratory: Negative.    Cardiovascular: Negative.   Gastrointestinal: Negative.   Genitourinary:  Positive for dyspareunia and vaginal bleeding. Negative for vaginal discharge.       Objective:   Physical Exam Vitals reviewed.  Constitutional:      General: She is not in acute distress.    Appearance: She is well-developed.  HENT:     Head: Normocephalic and atraumatic.  Eyes:     Conjunctiva/sclera: Conjunctivae normal.  Cardiovascular:     Rate and Rhythm: Normal rate.  Pulmonary:     Effort: Pulmonary effort is normal.  Genitourinary:    Comments: Tanner V Vulva:  No lesion Vagina:  Pink, no lesions, no discharge, +small  amount of blood at os c/w menses, can't visualize os well for ectropion due to blood.  Cervix:  No CMT Uterus:  Non tender, mobile Right adnexa--non tender, no mass Left adnexa--non tender, no mass Skin:    General: Skin is warm and dry.  Neurological:     Mental Status: She is alert and oriented to person, place, and time.  Psychiatric:        Mood and Affect: Mood normal.    Vitals:   02/22/24 0831  BP: 127/78  Pulse: 64  Weight: 175 lb (79.4 kg)  Height: 5' 4 (1.626 m)           Assessment & Plan:   Beta Hcg Pelvic US  complete STI testing on patient request (had condom break) Consider switiching to Mirena if above negative so there is good endometrial protection (prometirum makes Symphani too sleepy and not taking daily).  Could also consider OCPs if starting to cycle again.  "

## 2024-02-22 NOTE — Telephone Encounter (Signed)
 RN attempted to call patient to follow up to request re-peat aptima swab. RN left HIPAA compliant voicemail to return RN call.  Silvano LELON Piano, RN

## 2024-02-23 ENCOUNTER — Other Ambulatory Visit: Payer: Self-pay | Admitting: Medical Genetics

## 2024-02-23 ENCOUNTER — Ambulatory Visit: Payer: Self-pay | Admitting: Obstetrics & Gynecology

## 2024-02-23 DIAGNOSIS — Z006 Encounter for examination for normal comparison and control in clinical research program: Secondary | ICD-10-CM

## 2024-02-23 LAB — RPR+HBSAG+HCVAB+...
HIV Screen 4th Generation wRfx: NONREACTIVE
Hep C Virus Ab: NONREACTIVE
Hepatitis B Surface Ag: NEGATIVE
RPR Ser Ql: NONREACTIVE

## 2024-02-25 LAB — CERVICOVAGINAL ANCILLARY ONLY
Chlamydia: NEGATIVE
Comment: NEGATIVE
Comment: NEGATIVE
Comment: NORMAL
Neisseria Gonorrhea: NEGATIVE
Trichomonas: NEGATIVE

## 2024-02-29 ENCOUNTER — Ambulatory Visit

## 2024-02-29 ENCOUNTER — Telehealth: Payer: Self-pay

## 2024-02-29 DIAGNOSIS — N930 Postcoital and contact bleeding: Secondary | ICD-10-CM

## 2024-02-29 NOTE — Telephone Encounter (Signed)
 RN called [facility] to attempt add on order for Beta Hcg to previous labs completed. Per [facility], unable to add on Beta as past the time frame. Provider notified.  Silvano LELON Piano, RN

## 2024-03-16 LAB — GENECONNECT MOLECULAR SCREEN: Genetic Analysis Overall Interpretation: NEGATIVE
